# Patient Record
Sex: Male | Born: 1935 | Race: Black or African American | Hispanic: No | Marital: Married | Smoking: Former smoker
Health system: Southern US, Community
[De-identification: ages and names within clinical notes are randomized; demographics above are authoritative.]

## PROBLEM LIST (undated history)

## (undated) DIAGNOSIS — I10 Essential (primary) hypertension: Secondary | ICD-10-CM

## (undated) DIAGNOSIS — J189 Pneumonia, unspecified organism: Secondary | ICD-10-CM

## (undated) DIAGNOSIS — M1990 Unspecified osteoarthritis, unspecified site: Secondary | ICD-10-CM

## (undated) DIAGNOSIS — J449 Chronic obstructive pulmonary disease, unspecified: Secondary | ICD-10-CM

## (undated) DIAGNOSIS — R0600 Dyspnea, unspecified: Secondary | ICD-10-CM

## (undated) DIAGNOSIS — J9819 Other pulmonary collapse: Secondary | ICD-10-CM

## (undated) DIAGNOSIS — K219 Gastro-esophageal reflux disease without esophagitis: Secondary | ICD-10-CM

## (undated) DIAGNOSIS — M170 Bilateral primary osteoarthritis of knee: Secondary | ICD-10-CM

## (undated) HISTORY — DX: Essential (primary) hypertension: I10

## (undated) HISTORY — DX: Bilateral primary osteoarthritis of knee: M17.0

## (undated) HISTORY — PX: JOINT REPLACEMENT: SHX530

## (undated) HISTORY — PX: COLONOSCOPY: SHX174

## (undated) HISTORY — PX: DG  BONE DENSITY (ARMC HX): HXRAD1102

---

## 2015-11-30 DIAGNOSIS — I10 Essential (primary) hypertension: Secondary | ICD-10-CM | POA: Diagnosis not present

## 2015-11-30 DIAGNOSIS — N4289 Other specified disorders of prostate: Secondary | ICD-10-CM | POA: Diagnosis not present

## 2016-02-28 DIAGNOSIS — I10 Essential (primary) hypertension: Secondary | ICD-10-CM | POA: Diagnosis not present

## 2016-02-28 DIAGNOSIS — N4289 Other specified disorders of prostate: Secondary | ICD-10-CM | POA: Diagnosis not present

## 2016-02-28 DIAGNOSIS — Z6823 Body mass index (BMI) 23.0-23.9, adult: Secondary | ICD-10-CM | POA: Diagnosis not present

## 2016-07-08 DIAGNOSIS — Z6823 Body mass index (BMI) 23.0-23.9, adult: Secondary | ICD-10-CM | POA: Diagnosis not present

## 2016-07-08 DIAGNOSIS — Z0000 Encounter for general adult medical examination without abnormal findings: Secondary | ICD-10-CM | POA: Diagnosis not present

## 2016-07-08 DIAGNOSIS — N400 Enlarged prostate without lower urinary tract symptoms: Secondary | ICD-10-CM | POA: Diagnosis not present

## 2016-07-08 DIAGNOSIS — R002 Palpitations: Secondary | ICD-10-CM | POA: Diagnosis not present

## 2016-07-08 DIAGNOSIS — I10 Essential (primary) hypertension: Secondary | ICD-10-CM | POA: Diagnosis not present

## 2016-07-08 DIAGNOSIS — Z1389 Encounter for screening for other disorder: Secondary | ICD-10-CM | POA: Diagnosis not present

## 2016-07-08 DIAGNOSIS — Z131 Encounter for screening for diabetes mellitus: Secondary | ICD-10-CM | POA: Diagnosis not present

## 2016-08-25 DIAGNOSIS — Z87891 Personal history of nicotine dependence: Secondary | ICD-10-CM | POA: Diagnosis not present

## 2016-08-25 DIAGNOSIS — M810 Age-related osteoporosis without current pathological fracture: Secondary | ICD-10-CM | POA: Diagnosis not present

## 2016-08-25 DIAGNOSIS — M1990 Unspecified osteoarthritis, unspecified site: Secondary | ICD-10-CM | POA: Diagnosis not present

## 2016-08-25 DIAGNOSIS — I10 Essential (primary) hypertension: Secondary | ICD-10-CM | POA: Diagnosis not present

## 2016-08-25 DIAGNOSIS — Z13820 Encounter for screening for osteoporosis: Secondary | ICD-10-CM | POA: Diagnosis not present

## 2016-08-25 DIAGNOSIS — Z79899 Other long term (current) drug therapy: Secondary | ICD-10-CM | POA: Diagnosis not present

## 2016-10-02 DIAGNOSIS — Z125 Encounter for screening for malignant neoplasm of prostate: Secondary | ICD-10-CM | POA: Diagnosis not present

## 2016-10-02 DIAGNOSIS — M14862 Arthropathies in other specified diseases classified elsewhere, left knee: Secondary | ICD-10-CM | POA: Diagnosis not present

## 2016-10-02 DIAGNOSIS — Z6824 Body mass index (BMI) 24.0-24.9, adult: Secondary | ICD-10-CM | POA: Diagnosis not present

## 2016-10-02 DIAGNOSIS — N400 Enlarged prostate without lower urinary tract symptoms: Secondary | ICD-10-CM | POA: Diagnosis not present

## 2016-10-02 DIAGNOSIS — I10 Essential (primary) hypertension: Secondary | ICD-10-CM | POA: Diagnosis not present

## 2016-11-16 DIAGNOSIS — J069 Acute upper respiratory infection, unspecified: Secondary | ICD-10-CM | POA: Diagnosis not present

## 2016-11-16 DIAGNOSIS — J019 Acute sinusitis, unspecified: Secondary | ICD-10-CM | POA: Diagnosis not present

## 2016-11-16 DIAGNOSIS — J209 Acute bronchitis, unspecified: Secondary | ICD-10-CM | POA: Diagnosis not present

## 2016-12-31 DIAGNOSIS — M21611 Bunion of right foot: Secondary | ICD-10-CM | POA: Diagnosis not present

## 2016-12-31 DIAGNOSIS — Z0000 Encounter for general adult medical examination without abnormal findings: Secondary | ICD-10-CM | POA: Diagnosis not present

## 2016-12-31 DIAGNOSIS — M21612 Bunion of left foot: Secondary | ICD-10-CM | POA: Diagnosis not present

## 2016-12-31 DIAGNOSIS — Z79899 Other long term (current) drug therapy: Secondary | ICD-10-CM | POA: Diagnosis not present

## 2016-12-31 DIAGNOSIS — M2041 Other hammer toe(s) (acquired), right foot: Secondary | ICD-10-CM | POA: Diagnosis not present

## 2016-12-31 DIAGNOSIS — M130 Polyarthritis, unspecified: Secondary | ICD-10-CM | POA: Diagnosis not present

## 2016-12-31 DIAGNOSIS — Z791 Long term (current) use of non-steroidal anti-inflammatories (NSAID): Secondary | ICD-10-CM | POA: Diagnosis not present

## 2016-12-31 DIAGNOSIS — I10 Essential (primary) hypertension: Secondary | ICD-10-CM | POA: Diagnosis not present

## 2016-12-31 DIAGNOSIS — M2042 Other hammer toe(s) (acquired), left foot: Secondary | ICD-10-CM | POA: Diagnosis not present

## 2016-12-31 DIAGNOSIS — Z6824 Body mass index (BMI) 24.0-24.9, adult: Secondary | ICD-10-CM | POA: Diagnosis not present

## 2017-01-07 DIAGNOSIS — M14862 Arthropathies in other specified diseases classified elsewhere, left knee: Secondary | ICD-10-CM | POA: Diagnosis not present

## 2017-01-07 DIAGNOSIS — Z6823 Body mass index (BMI) 23.0-23.9, adult: Secondary | ICD-10-CM | POA: Diagnosis not present

## 2017-01-07 DIAGNOSIS — I10 Essential (primary) hypertension: Secondary | ICD-10-CM | POA: Diagnosis not present

## 2017-01-07 DIAGNOSIS — N400 Enlarged prostate without lower urinary tract symptoms: Secondary | ICD-10-CM | POA: Diagnosis not present

## 2017-04-09 DIAGNOSIS — Z6823 Body mass index (BMI) 23.0-23.9, adult: Secondary | ICD-10-CM | POA: Diagnosis not present

## 2017-04-09 DIAGNOSIS — N400 Enlarged prostate without lower urinary tract symptoms: Secondary | ICD-10-CM | POA: Diagnosis not present

## 2017-04-09 DIAGNOSIS — M14862 Arthropathies in other specified diseases classified elsewhere, left knee: Secondary | ICD-10-CM | POA: Diagnosis not present

## 2017-04-09 DIAGNOSIS — I10 Essential (primary) hypertension: Secondary | ICD-10-CM | POA: Diagnosis not present

## 2017-04-10 DIAGNOSIS — I10 Essential (primary) hypertension: Secondary | ICD-10-CM | POA: Diagnosis not present

## 2017-05-05 DIAGNOSIS — J209 Acute bronchitis, unspecified: Secondary | ICD-10-CM | POA: Diagnosis not present

## 2017-05-08 DIAGNOSIS — M25551 Pain in right hip: Secondary | ICD-10-CM | POA: Diagnosis not present

## 2017-05-08 DIAGNOSIS — Z79899 Other long term (current) drug therapy: Secondary | ICD-10-CM | POA: Diagnosis not present

## 2017-05-08 DIAGNOSIS — M85861 Other specified disorders of bone density and structure, right lower leg: Secondary | ICD-10-CM | POA: Diagnosis not present

## 2017-05-08 DIAGNOSIS — M25461 Effusion, right knee: Secondary | ICD-10-CM | POA: Diagnosis not present

## 2017-05-08 DIAGNOSIS — M479 Spondylosis, unspecified: Secondary | ICD-10-CM | POA: Diagnosis not present

## 2017-05-08 DIAGNOSIS — M5186 Other intervertebral disc disorders, lumbar region: Secondary | ICD-10-CM | POA: Diagnosis not present

## 2017-05-08 DIAGNOSIS — M545 Low back pain: Secondary | ICD-10-CM | POA: Diagnosis not present

## 2017-05-08 DIAGNOSIS — I10 Essential (primary) hypertension: Secondary | ICD-10-CM | POA: Diagnosis not present

## 2017-05-08 DIAGNOSIS — M5136 Other intervertebral disc degeneration, lumbar region: Secondary | ICD-10-CM | POA: Diagnosis not present

## 2017-05-08 DIAGNOSIS — R262 Difficulty in walking, not elsewhere classified: Secondary | ICD-10-CM | POA: Diagnosis not present

## 2017-05-08 DIAGNOSIS — M47816 Spondylosis without myelopathy or radiculopathy, lumbar region: Secondary | ICD-10-CM | POA: Diagnosis not present

## 2017-05-08 DIAGNOSIS — M5126 Other intervertebral disc displacement, lumbar region: Secondary | ICD-10-CM | POA: Diagnosis not present

## 2017-05-08 DIAGNOSIS — M160 Bilateral primary osteoarthritis of hip: Secondary | ICD-10-CM | POA: Diagnosis not present

## 2017-05-11 DIAGNOSIS — Z791 Long term (current) use of non-steroidal anti-inflammatories (NSAID): Secondary | ICD-10-CM | POA: Diagnosis not present

## 2017-05-11 DIAGNOSIS — J019 Acute sinusitis, unspecified: Secondary | ICD-10-CM | POA: Diagnosis not present

## 2017-05-11 DIAGNOSIS — R0600 Dyspnea, unspecified: Secondary | ICD-10-CM | POA: Diagnosis not present

## 2017-05-11 DIAGNOSIS — Z79899 Other long term (current) drug therapy: Secondary | ICD-10-CM | POA: Diagnosis not present

## 2017-05-11 DIAGNOSIS — M1990 Unspecified osteoarthritis, unspecified site: Secondary | ICD-10-CM | POA: Diagnosis not present

## 2017-05-11 DIAGNOSIS — N19 Unspecified kidney failure: Secondary | ICD-10-CM | POA: Diagnosis not present

## 2017-05-11 DIAGNOSIS — R0602 Shortness of breath: Secondary | ICD-10-CM | POA: Diagnosis not present

## 2017-05-11 DIAGNOSIS — R1013 Epigastric pain: Secondary | ICD-10-CM | POA: Diagnosis not present

## 2017-05-11 DIAGNOSIS — J209 Acute bronchitis, unspecified: Secondary | ICD-10-CM | POA: Diagnosis not present

## 2017-05-11 DIAGNOSIS — R5383 Other fatigue: Secondary | ICD-10-CM | POA: Diagnosis not present

## 2017-05-11 DIAGNOSIS — Z0131 Encounter for examination of blood pressure with abnormal findings: Secondary | ICD-10-CM | POA: Diagnosis not present

## 2017-05-11 DIAGNOSIS — Z87891 Personal history of nicotine dependence: Secondary | ICD-10-CM | POA: Diagnosis not present

## 2017-05-11 DIAGNOSIS — I10 Essential (primary) hypertension: Secondary | ICD-10-CM | POA: Diagnosis not present

## 2017-05-11 DIAGNOSIS — R079 Chest pain, unspecified: Secondary | ICD-10-CM | POA: Diagnosis not present

## 2017-05-11 DIAGNOSIS — D473 Essential (hemorrhagic) thrombocythemia: Secondary | ICD-10-CM | POA: Diagnosis not present

## 2017-05-26 ENCOUNTER — Encounter: Payer: Self-pay | Admitting: *Deleted

## 2017-05-28 ENCOUNTER — Encounter: Payer: Self-pay | Admitting: Cardiology

## 2017-05-28 NOTE — Progress Notes (Signed)
Cardiology Office Note  Date: 05/29/2017   ID: Ananias Kolander, DOB 1931-12-02, MRN [id number]  PCP: Neale Burly, MD  Consulting Cardiologist: Rozann Lesches, MD   Chief Complaint  Patient presents with  . Bradycardia    History of Present Illness: Noah Ray is an 83 y.o. male referred for cardiology consultation by Dr. Sherrie Sport the evaluation of bradycardia. Records indicate that he was seen in the ER at [facility] in late July complaining of weakness and shortness of breath. He had been seen at [facility] just prior to that with ECG reportedly showing sinus bradycardia at 48. Lab work and ECG as well as chest x-ray from ER visit are outlined below.  He is here today with a family member. His heart rate is in the 60s today. He states that he was taken off of atenolol by Dr. Sherrie Sport a few months ago related to bradycardia. He has had no syncope.  He does describe dyspnea on exertion however that has been going on for the last several weeks. 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 No specific chest pain at that time, but he does say that he had an episode of chest pain in the past while he was walking across the parking lot to work. He has no known history of ischemic heart disease but this has not been objectively evaluated recently.  He is also limited by significant bilateral knee arthritic pain, has had some right hip and flank pain with ambulation as well. He is using a cane. He has pending orthopedic consultation later this month.  I reviewed his medications. Antihypertensives include Norvasc and Lotensin at this point, neither of which should be significantly lowering his heart rate.  Past Medical History:  Diagnosis Date  . Essential hypertension   . Osteoarthritis of both knees     Past Surgical History:  Procedure Laterality Date  . DG  BONE DENSITY ([place] HX)      . REPAIR IMPERFORATE ANUS / ANORECTOPLASTY      Current Outpatient Prescriptions  Medication Sig Dispense Refill  . amLODipine (NORVASC) 5 MG tablet Take 5 mg by mouth daily.    . benazepril (LOTENSIN) 10 MG tablet Take 10 mg by mouth daily.    . Cholecalciferol (VITAMIN D3) 1000 units CAPS Take by mouth.    . meloxicam (MOBIC) 15 MG tablet Take 15 mg by mouth daily.    . Omega-3 Fatty Acids (FISH OIL) 1000 MG CAPS Take by mouth 3 (three) times daily.      No current facility-administered medications for this visit.    Allergies:  Patient has no known allergies.   Social History: The patient  reports that he has quit smoking. His smoking use included Cigarettes. He has never used smokeless tobacco. He reports that he does not drink alcohol or use drugs.   Family History: The patient's family history includes Prostate cancer in his father; Stomach cancer in his mother.   ROS:  Please see the history of present illness. Otherwise, complete review of systems is positive for bilateral knee pain, occasional hoarseness.  All other systems are reviewed and negative.   Physical Exam: VS:  BP 130/68   Pulse 65   Ht 6\' 5"  (1.956 m)   Wt 186 lb (84.4 kg)   SpO2 98%   BMI 22.06 kg/m , BMI Body mass index is 22.06  kg/m.  Wt Readings from Last 3 Encounters:  05/29/17 186 lb (84.4 kg)    General: Elderly male, appears comfortable at rest. HEENT: Conjunctiva and lids normal, oropharynx clear. Neck: Supple, no elevated JVP or carotid bruits, no thyromegaly. Lungs: Clear to auscultation, nonlabored breathing at rest. Cardiac: Regular rate and rhythm, no S3, soft systolic murmur, no pericardial rub. Abdomen: Soft, nontender, bowel sounds present, no guarding or rebound. Extremities: No pitting edema, distal pulses 2+. Skin: Warm and dry. Musculoskeletal: Mild kyphosis. Neuropsychiatric: Alert and oriented x3, affect grossly appropriate.  ECG: I personally reviewed the tracing from  05/11/2017 which showed sinus bradycardia at 51 bpm with decreased R wave progression and nonspecific T-wave changes.  Recent Labwork:  July 2018: Potassium 4.4, BUN 30, creatinine 1.4, AST 15, ALT 13, anti-proBNP 17.5, troponin T less than 0.01, TSH 1.22, hemoglobin 15.2, platelets 431  Other Studies Reviewed Today:  Chest x-ray 05/11/2017 ([facility]): No active cardiopulmonary disease.  Assessment and Plan:  1. Bradycardia. Agree with discontinuation of atenolol which was already done by Dr. Sherrie Sport. It sounds like however he has had documented bradycardia even since stopping the medication. No obvious syncope. ECG abnormal with decreased R wave progression and leftward axis, normal PR interval. We will obtain a 48-hour Holter monitor to better assess heart rate variability.  2. Dyspnea on exertion, worse in the last several weeks. Troponin T level was negative during ER evaluation in July. He has had chest pain in the past but not recently. Baseline ECG abnormal as discussed above. Cardiac risk factors include age and gender as well as hypertension and previous tobacco use. He has not undergone cardiac structural or ischemic evaluation. We will obtain an echocardiogram as well as a Lexiscan Myoview for more objective assessment.  3. Essential hypertension, blood pressure adequately controlled today on Norvasc and Lotensin.  4. Bilateral knee arthritis with pain on ambulation pending orthopedic consultation.  Current medicines were reviewed with the patient today.   Orders Placed This Encounter  Procedures  . NM Myocar Multi W/Spect W/Wall Motion / EF  . Cardiac event monitor  . ECHOCARDIOGRAM COMPLETE    Disposition: Call with test results.  Signed, Satira Sark, MD, [facility] 05/29/2017 9:28 AM    [facility] [place], [place], [place] [address] Phone: [phone]; Fax: [phone]

## 2017-05-29 ENCOUNTER — Encounter: Payer: Self-pay | Admitting: Cardiology

## 2017-05-29 ENCOUNTER — Ambulatory Visit (INDEPENDENT_AMBULATORY_CARE_PROVIDER_SITE_OTHER): Payer: Medicare HMO | Admitting: Cardiology

## 2017-05-29 ENCOUNTER — Telehealth: Payer: Self-pay | Admitting: Cardiology

## 2017-05-29 VITALS — BP 130/68 | HR 65 | Ht 77.0 in | Wt 186.0 lb

## 2017-05-29 DIAGNOSIS — R0609 Other forms of dyspnea: Secondary | ICD-10-CM | POA: Diagnosis not present

## 2017-05-29 DIAGNOSIS — R001 Bradycardia, unspecified: Secondary | ICD-10-CM

## 2017-05-29 DIAGNOSIS — I10 Essential (primary) hypertension: Secondary | ICD-10-CM

## 2017-05-29 DIAGNOSIS — M170 Bilateral primary osteoarthritis of knee: Secondary | ICD-10-CM

## 2017-05-29 DIAGNOSIS — R0600 Dyspnea, unspecified: Secondary | ICD-10-CM

## 2017-05-29 NOTE — Telephone Encounter (Signed)
Echo & Lexiscan for DOE/ abnormal ecg scheduled at [facility] Jun 11, 2017

## 2017-05-29 NOTE — Patient Instructions (Signed)
Medication Instructions:  Your physician recommends that you continue on your current medications as directed. Please refer to the Current Medication list given to you today.  Labwork: none  Testing/Procedures: Your physician has requested that you have an echocardiogram. Echocardiography is a painless test that uses sound waves to create images of your heart. It provides your doctor with information about the size and shape of your heart and how well your heart's chambers and valves are working. This procedure takes approximately one hour. There are no restrictions for this procedure.  Your physician has requested that you have a lexiscan myoview. For further information please visit [email or web address]. Please follow instruction sheet, as given.  Your physician has recommended that you wear a holter monitor FOR 48 HOURS. Holter monitors are medical devices that record the heart's electrical activity. Doctors most often use these monitors to diagnose arrhythmias. Arrhythmias are problems with the speed or rhythm of the heartbeat. The monitor is a small, portable device. You can wear one while you do your normal daily activities. This is usually used to diagnose what is causing palpitations/syncope (passing out).  Follow-Up: Your physician recommends that you schedule a follow-up appointment PENDING TEST RESULTS  Any Other Special Instructions Will Be Listed Below (If Applicable).  If you need a refill on your cardiac medications before your next appointment, please call your pharmacy.

## 2017-06-03 ENCOUNTER — Encounter (INDEPENDENT_AMBULATORY_CARE_PROVIDER_SITE_OTHER): Payer: Medicare HMO

## 2017-06-03 DIAGNOSIS — R0609 Other forms of dyspnea: Secondary | ICD-10-CM

## 2017-06-03 DIAGNOSIS — R001 Bradycardia, unspecified: Secondary | ICD-10-CM

## 2017-06-03 DIAGNOSIS — R0600 Dyspnea, unspecified: Secondary | ICD-10-CM

## 2017-06-03 NOTE — Addendum Note (Signed)
Addended by: Acquanetta Chain on: 06/03/2017 09:34 AM   Modules accepted: Orders

## 2017-06-08 ENCOUNTER — Encounter (HOSPITAL_COMMUNITY): Payer: Medicare HMO

## 2017-06-08 DIAGNOSIS — M160 Bilateral primary osteoarthritis of hip: Secondary | ICD-10-CM | POA: Diagnosis not present

## 2017-06-11 ENCOUNTER — Encounter (HOSPITAL_COMMUNITY)
Admission: RE | Admit: 2017-06-11 | Discharge: 2017-06-11 | Disposition: A | Discharge status: Home / Self Care | Payer: Medicare HMO | Source: Ambulatory Visit | Attending: Cardiology | Admitting: Cardiology

## 2017-06-11 ENCOUNTER — Ambulatory Visit (HOSPITAL_BASED_OUTPATIENT_CLINIC_OR_DEPARTMENT_OTHER)
Admission: RE | Admit: 2017-06-11 | Discharge: 2017-06-11 | Disposition: A | Discharge status: Home / Self Care | Payer: Medicare HMO | Source: Ambulatory Visit | Attending: Cardiology | Admitting: Cardiology

## 2017-06-11 ENCOUNTER — Encounter (HOSPITAL_COMMUNITY): Payer: Self-pay

## 2017-06-11 ENCOUNTER — Encounter (HOSPITAL_BASED_OUTPATIENT_CLINIC_OR_DEPARTMENT_OTHER)
Admission: RE | Admit: 2017-06-11 | Discharge: 2017-06-11 | Disposition: A | Discharge status: Home / Self Care | Payer: Medicare HMO | Source: Ambulatory Visit | Attending: Cardiology | Admitting: Cardiology

## 2017-06-11 DIAGNOSIS — R0600 Dyspnea, unspecified: Secondary | ICD-10-CM

## 2017-06-11 DIAGNOSIS — I081 Rheumatic disorders of both mitral and tricuspid valves: Secondary | ICD-10-CM | POA: Insufficient documentation

## 2017-06-11 DIAGNOSIS — R0609 Other forms of dyspnea: Secondary | ICD-10-CM | POA: Diagnosis not present

## 2017-06-11 LAB — ECHOCARDIOGRAM COMPLETE
AOVTI: 33.8 cm
AV Area mean vel: 3.34 cm2
AV VEL mean LVOT/AV: 0.88
AV peak Index: 1.68
AV vel: 3.41
AVA: 3.41 cm2
AVAREAMEANVIN: 1.57 cm2/m2
AVAREAVTI: 3.58 cm2
AVAREAVTIIND: 1.6 cm2/m2
AVG: 4 mmHg
AVPG: 10 mmHg
AVPKVEL: 156 cm/s
Ao pk vel: 0.94 m/s
CHL CUP AV VALUE AREA INDEX: 1.6
CHL CUP DOP CALC LVOT VTI: 30.3 cm
CHL CUP STROKE VOLUME: 53 mL
DOP CAL AO MEAN VELOCITY: 91.7 cm/s
EERAT: 5.9
EWDT: 324 ms
FS: 38 % (ref 28–44)
IVS/LV PW RATIO, ED: 1.34
LA diam end sys: 34 mm
LA diam index: 1.59 cm/m2
LA vol index: 22.3 mL/m2
LASIZE: 34 mm
LAVOL: 47.6 mL
LAVOLA4C: 46.1 mL
LV PW d: 8.67 mm — AB (ref 0.6–1.1)
LV SIMPSON'S DISK: 66
LV TDI E'LATERAL: 14.3
LV sys vol: 28 mL (ref 21–61)
LVDIAVOL: 80 mL (ref 62–150)
LVDIAVOLIN: 38 mL/m2
LVEEAVG: 5.9
LVEEMED: 5.9
LVELAT: 14.3 cm/s
LVOT area: 3.8 cm2
LVOT peak VTI: 0.9 cm
LVOT peak grad rest: 9 mmHg
LVOT peak vel: 147 cm/s
LVOTD: 22 mm
LVOTSV: 115 mL
LVSYSVOLIN: 13 mL/m2
MV Dec: 324
MV Peak grad: 3 mmHg
MV pk E vel: 84.4 m/s
MVPKAVEL: 115 m/s
RV LATERAL S' VELOCITY: 13.6 cm/s
RV TAPSE: 25.2 mm
RV sys press: 34 mmHg
Reg peak vel: 277 cm/s
TDI e' medial: 8.27
TR max vel: 277 cm/s

## 2017-06-11 LAB — NM MYOCAR MULTI W/SPECT W/WALL MOTION / EF
CHL CUP NUCLEAR SDS: 1
LV dias vol: 95 mL (ref 62–150)
LVSYSVOL: 27 mL
Peak HR: 89 {beats}/min
RATE: 0.31
Rest HR: 55 {beats}/min
SRS: 6
SSS: 7
TID: 1.23

## 2017-06-11 MED ORDER — SODIUM CHLORIDE 0.9% FLUSH
INTRAVENOUS | Status: AC
  Administered 2017-06-11: 10 mL via INTRAVENOUS
  Filled 2017-06-11: qty 10

## 2017-06-11 MED ORDER — TECHNETIUM TC 99M TETROFOSMIN IV KIT
10.0000 | PACK | Freq: Once | INTRAVENOUS | Status: AC | PRN
  Administered 2017-06-11: 11 via INTRAVENOUS

## 2017-06-11 MED ORDER — TECHNETIUM TC 99M TETROFOSMIN IV KIT
30.0000 | PACK | Freq: Once | INTRAVENOUS | Status: AC | PRN
  Administered 2017-06-11: 30 via INTRAVENOUS

## 2017-06-11 MED ORDER — REGADENOSON 0.4 MG/5ML IV SOLN
INTRAVENOUS | Status: AC
  Administered 2017-06-11: 0.4 mg via INTRAVENOUS
  Filled 2017-06-11: qty 5

## 2017-06-11 NOTE — Progress Notes (Signed)
*  PRELIMINARY RESULTS* Echocardiogram 2D Echocardiogram has been performed.  Noah Ray 06/11/2017, 10:07 AM

## 2017-06-12 ENCOUNTER — Telehealth: Payer: Self-pay

## 2017-06-12 NOTE — Telephone Encounter (Signed)
Patient notified. Routed to PCP 

## 2017-06-12 NOTE — Telephone Encounter (Signed)
-----   Message from Merlene Laughter, LPN sent at 7/00/1749 12:16 PM EDT -----   ----- Message ----- From: Satira Sark, MD Sent: 06/12/2017   9:57 AM To: Merlene Laughter, LPN  Results reviewed. Monitoring was overall reassuring, no evidence of heart block or pauses to suggest further evaluation of bradycardia at this time. In light of his recent echocardiogram and Myoview results, no further cardiac testing is planned. He can keep follow-up with Dr. Sherrie Sport for now. A copy of this test should be forwarded to Neale Burly, MD.

## 2017-06-12 NOTE — Telephone Encounter (Signed)
-----   Message from Merlene Laughter, LPN sent at 0/81/3887  7:37 AM EDT -----   ----- Message ----- From: Satira Sark, MD Sent: 06/11/2017   3:46 PM To: Merlene Laughter, LPN  Results reviewed. Overall reassuring, low risk stress test without significant ischemic burden and normal LVEF. Await cardiac monitor results. A copy of this test should be forwarded to Neale Burly, MD.

## 2017-06-12 NOTE — Telephone Encounter (Signed)
-----   Message from Merlene Laughter, LPN sent at 7/85/8850  7:38 AM EDT -----   ----- Message ----- From: Satira Sark, MD Sent: 06/11/2017  10:52 AM To: Merlene Laughter, LPN  Results reviewed. LVEF is normal at 60-65% with only mild diastolic dysfunction. No major valvular abnormalities to explain shortness of breath. A copy of this test should be forwarded to Neale Burly, MD.

## 2017-07-10 DIAGNOSIS — I10 Essential (primary) hypertension: Secondary | ICD-10-CM | POA: Diagnosis not present

## 2017-07-10 DIAGNOSIS — Z0000 Encounter for general adult medical examination without abnormal findings: Secondary | ICD-10-CM | POA: Diagnosis not present

## 2017-07-10 DIAGNOSIS — Z1389 Encounter for screening for other disorder: Secondary | ICD-10-CM | POA: Diagnosis not present

## 2017-07-10 DIAGNOSIS — Z6822 Body mass index (BMI) 22.0-22.9, adult: Secondary | ICD-10-CM | POA: Diagnosis not present

## 2017-07-10 DIAGNOSIS — M14862 Arthropathies in other specified diseases classified elsewhere, left knee: Secondary | ICD-10-CM | POA: Diagnosis not present

## 2017-07-10 DIAGNOSIS — N400 Enlarged prostate without lower urinary tract symptoms: Secondary | ICD-10-CM | POA: Diagnosis not present

## 2017-07-10 DIAGNOSIS — J441 Chronic obstructive pulmonary disease with (acute) exacerbation: Secondary | ICD-10-CM | POA: Diagnosis not present

## 2017-08-17 DIAGNOSIS — J441 Chronic obstructive pulmonary disease with (acute) exacerbation: Secondary | ICD-10-CM | POA: Diagnosis not present

## 2017-08-17 DIAGNOSIS — M14862 Arthropathies in other specified diseases classified elsewhere, left knee: Secondary | ICD-10-CM | POA: Diagnosis not present

## 2017-08-17 DIAGNOSIS — I10 Essential (primary) hypertension: Secondary | ICD-10-CM | POA: Diagnosis not present

## 2017-08-17 DIAGNOSIS — Z6823 Body mass index (BMI) 23.0-23.9, adult: Secondary | ICD-10-CM | POA: Diagnosis not present

## 2017-08-17 DIAGNOSIS — N400 Enlarged prostate without lower urinary tract symptoms: Secondary | ICD-10-CM | POA: Diagnosis not present

## 2017-09-09 DIAGNOSIS — M170 Bilateral primary osteoarthritis of knee: Secondary | ICD-10-CM | POA: Diagnosis not present

## 2017-09-09 DIAGNOSIS — M160 Bilateral primary osteoarthritis of hip: Secondary | ICD-10-CM | POA: Diagnosis not present

## 2017-09-09 DIAGNOSIS — M25551 Pain in right hip: Secondary | ICD-10-CM | POA: Diagnosis not present

## 2017-10-27 DIAGNOSIS — M1611 Unilateral primary osteoarthritis, right hip: Secondary | ICD-10-CM | POA: Diagnosis not present

## 2017-10-27 DIAGNOSIS — M25551 Pain in right hip: Secondary | ICD-10-CM | POA: Diagnosis not present

## 2017-10-27 DIAGNOSIS — M1612 Unilateral primary osteoarthritis, left hip: Secondary | ICD-10-CM | POA: Diagnosis not present

## 2017-10-28 DIAGNOSIS — M25552 Pain in left hip: Secondary | ICD-10-CM | POA: Diagnosis not present

## 2017-10-28 DIAGNOSIS — M25551 Pain in right hip: Secondary | ICD-10-CM | POA: Diagnosis not present

## 2017-10-28 DIAGNOSIS — Z6824 Body mass index (BMI) 24.0-24.9, adult: Secondary | ICD-10-CM | POA: Diagnosis not present

## 2017-11-04 ENCOUNTER — Ambulatory Visit: Payer: Self-pay | Admitting: Orthopedic Surgery

## 2017-11-10 NOTE — Pre-Procedure Instructions (Signed)
Noah Ray  11/10/2017      [facility] [address] [place] [address] Phone: [phone] Fax: [phone]    Your procedure is scheduled on 11/16/2017.  Report to [facility] Admitting at [place].M.  Call this number if you have problems the morning of surgery:  [phone]   Remember:  Do not eat food or drink liquids after midnight.   Continue all medications as directed by your physician except follow these medication instructions before surgery below   Take these medicines the morning of surgery with A SIP OF WATER: Amlodipine (Norvasc)  7 days prior to surgery STOP taking any Meloxicam (Mobic), Aspirin (unless otherwise instructed by your surgeon), Aleve, Naproxen, Ibuprofen, Motrin, Advil, Goody's, BC's, all herbal medications, fish oil, and all vitamins    Do not wear jewelry  Do not wear lotions, powders, or colognes, or deodorant.  Men may shave face and neck.  Do not bring valuables to the hospital.  [facility] is not responsible for any belongings or valuables.  Hearing aids, eyeglasses, contacts, dentures or bridgework may not be worn into surgery.  Leave your suitcase in the car.  After surgery it may be brought to your room.  For patients admitted to the hospital, discharge time will be determined by your treatment team.  Patients discharged the day of surgery will not be allowed to drive home.   Name and phone number of your driver:    Special instructions:   [facility]- Preparing For Surgery  Before surgery, you can play an important role. Because skin is not sterile, your skin needs to be as free of germs as possible. You can reduce the number of germs on your skin by washing with CHG (chlorahexidine gluconate) Soap before surgery.  CHG is an antiseptic cleaner which kills germs and bonds with the skin to continue killing germs even after washing.  Please do not use if  you have an allergy to CHG or antibacterial soaps. If your skin becomes reddened/irritated stop using the CHG.  Do not shave (including legs and underarms) for at least 48 hours prior to first CHG shower. It is OK to shave your face.  Please follow these instructions carefully.   1. Shower the NIGHT BEFORE SURGERY and the MORNING OF SURGERY with CHG.   2. If you chose to wash your hair, wash your hair first as usual with your normal shampoo.  3. After you shampoo, rinse your hair and body thoroughly to remove the shampoo.  4. Use CHG as you would any other liquid soap. You can apply CHG directly to the skin and wash gently with a scrungie or a clean washcloth.   5. Apply the CHG Soap to your body ONLY FROM THE NECK DOWN.  Do not use on open wounds or open sores. Avoid contact with your eyes, ears, mouth and genitals (private parts). Wash Face and genitals (private parts)  with your normal soap.  6. Wash thoroughly, paying special attention to the area where your surgery will be performed.  7. Thoroughly rinse your body with warm water from the neck down.  8. DO NOT shower/wash with your normal soap after using and rinsing off the CHG Soap.  9. Pat yourself dry with a CLEAN TOWEL.  10. Wear CLEAN PAJAMAS to bed the night before surgery, wear comfortable clothes the morning of surgery  11. Place CLEAN SHEETS on your bed the night  of your first shower and DO NOT SLEEP WITH PETS.    Day of Surgery: Shower as stated above. Do not apply any deodorants/lotions.  Please wear clean clothes to the hospital/surgery center.      Please read over the following fact sheets that you were given.

## 2017-11-11 ENCOUNTER — Other Ambulatory Visit: Payer: Self-pay

## 2017-11-11 ENCOUNTER — Encounter (HOSPITAL_COMMUNITY)
Admission: RE | Admit: 2017-11-11 | Discharge: 2017-11-11 | Disposition: A | Discharge status: Home / Self Care | Payer: Medicare HMO | Source: Ambulatory Visit | Attending: Orthopedic Surgery | Admitting: Orthopedic Surgery

## 2017-11-11 ENCOUNTER — Encounter (HOSPITAL_COMMUNITY): Payer: Self-pay

## 2017-11-11 DIAGNOSIS — M13851 Other specified arthritis, right hip: Secondary | ICD-10-CM | POA: Diagnosis not present

## 2017-11-11 DIAGNOSIS — Z01812 Encounter for preprocedural laboratory examination: Secondary | ICD-10-CM | POA: Diagnosis present

## 2017-11-11 HISTORY — DX: Other pulmonary collapse: J98.19

## 2017-11-11 HISTORY — DX: Chronic obstructive pulmonary disease, unspecified: J44.9

## 2017-11-11 HISTORY — DX: Dyspnea, unspecified: R06.00

## 2017-11-11 LAB — BASIC METABOLIC PANEL
Anion gap: 12 (ref 5–15)
BUN: 24 mg/dL — ABNORMAL HIGH (ref 6–20)
CHLORIDE: 106 mmol/L (ref 101–111)
CO2: 20 mmol/L — ABNORMAL LOW (ref 22–32)
Calcium: 9.9 mg/dL (ref 8.9–10.3)
Creatinine, Ser: 1.5 mg/dL — ABNORMAL HIGH (ref 0.61–1.24)
GFR calc non Af Amer: 41 mL/min — ABNORMAL LOW (ref >60)
GFR, EST AFRICAN AMERICAN: 48 mL/min — AB (ref >60)
Glucose, Bld: 138 mg/dL — ABNORMAL HIGH (ref 65–99)
Potassium: 4.2 mmol/L (ref 3.5–5.1)
SODIUM: 138 mmol/L (ref 135–145)

## 2017-11-11 LAB — CBC
HCT: 44.1 % (ref 39.0–52.0)
HEMOGLOBIN: 15.4 g/dL (ref 13.0–17.0)
MCH: 31 pg (ref 26.0–34.0)
MCHC: 34.9 g/dL (ref 30.0–36.0)
MCV: 88.9 fL (ref 78.0–100.0)
Platelets: 293 10*3/uL (ref 150–400)
RBC: 4.96 MIL/uL (ref 4.22–5.81)
RDW: 13.6 % (ref 11.5–15.5)
WBC: 5.9 10*3/uL (ref 4.0–10.5)

## 2017-11-11 LAB — TYPE AND SCREEN
ABO/RH(D): B POS
Antibody Screen: NEGATIVE

## 2017-11-11 LAB — SURGICAL PCR SCREEN
MRSA, PCR: NEGATIVE
Staphylococcus aureus: NEGATIVE

## 2017-11-11 LAB — ABO/RH: ABO/RH(D): B POS

## 2017-11-11 NOTE — Progress Notes (Signed)
PCP - Stoney Bang Cardiologist - patient denies  Chest x-ray - n/a EKG - 05/11/2017 Stress Test - 06/11/2017 ECHO - 06/11/2017 Cardiac Cath - patient denies  Sleep Study - patient denies   Anesthesia review: n/a  Patient denies shortness of breath, fever, cough and chest pain at PAT appointment   Patient verbalized understanding of instructions that were given to them at the PAT appointment. Patient was also instructed that they will need to review over the PAT instructions again at home before surgery.

## 2017-11-13 ENCOUNTER — Ambulatory Visit: Payer: Self-pay | Admitting: Orthopedic Surgery

## 2017-11-13 MED ORDER — ACETAMINOPHEN 10 MG/ML IV SOLN
1000.0000 mg | INTRAVENOUS | Status: AC
  Administered 2017-11-16: 1000 mg via INTRAVENOUS
  Filled 2017-11-13: qty 100

## 2017-11-13 MED ORDER — SODIUM CHLORIDE 0.9 % IV SOLN
1000.0000 mg | INTRAVENOUS | Status: AC
  Administered 2017-11-16: 1000 mg via INTRAVENOUS
  Filled 2017-11-13: qty 1100

## 2017-11-13 MED ORDER — CEFAZOLIN SODIUM-DEXTROSE 2-4 GM/100ML-% IV SOLN
2.0000 g | INTRAVENOUS | Status: AC
  Administered 2017-11-16: 2 g via INTRAVENOUS
  Filled 2017-11-13: qty 100

## 2017-11-13 MED ORDER — SODIUM CHLORIDE 0.9 % IV SOLN: INTRAVENOUS | Status: DC

## 2017-11-13 NOTE — H&P (View-Only) (Signed)
TOTAL HIP ADMISSION H&P  Patient is admitted for right total hip arthroplasty.  Subjective:  Chief Complaint: right hip pain  HPI: Noah Ray, 84 y.o. male, has a history of pain and functional disability in the right hip(s) due to arthritis and patient has failed non-surgical conservative treatments for greater than 12 weeks to include NSAID's and/or analgesics, flexibility and strengthening excercises, use of assistive devices, weight reduction as appropriate and activity modification.  Onset of symptoms was gradual starting 5 years ago with rapidlly worsening course since that time.The patient noted no past surgery on the right hip(s).  Patient currently rates pain in the right hip at 10 out of 10 with activity. Patient has night pain, worsening of pain with activity and weight bearing, trendelenberg gait, pain that interfers with activities of daily living, pain with passive range of motion and crepitus. Patient has evidence of subchondral cysts, subchondral sclerosis, periarticular osteophytes and joint space narrowing by imaging studies. This condition presents safety issues increasing the risk of falls. There is no current active infection.  There are no active problems to display for this patient.  Past Medical History:  Diagnosis Date  . Collapsed lung    history of; 35+ years ago  . COPD (chronic obstructive pulmonary disease) ([place])   . Dyspnea    occasional with exertion  . Essential hypertension   . Osteoarthritis of both knees     Past Surgical History:  Procedure Laterality Date  . DG  BONE DENSITY ([place] HX)    . REPAIR IMPERFORATE ANUS / ANORECTOPLASTY      Current Outpatient Medications  Medication Sig Dispense Refill Last Dose  . acetaminophen (TYLENOL) 650 MG CR tablet Take 1,300 mg by mouth 2 (two) times daily as needed for pain.     Marland Kitchen albuterol (VENTOLIN HFA) 108 (90 Base) MCG/ACT inhaler Inhale 1 puff into the lungs every 6 (six) hours as needed for wheezing or  shortness of breath.     Marland Kitchen amLODipine (NORVASC) 5 MG tablet Take 5 mg by mouth daily.   Taking  . benazepril (LOTENSIN) 10 MG tablet Take 10 mg by mouth daily.   Taking  . Cholecalciferol (VITAMIN D3) 1000 units CAPS Take 1,000 Units by mouth daily.    Taking  . Glucosamine-Chondroitin (MOVE FREE PO) Take 1 tablet by mouth daily.     . meloxicam (MOBIC) 15 MG tablet Take 15 mg by mouth daily.   11/06/2017  . Omega-3 Fatty Acids (FISH OIL) 1000 MG CAPS Take 1,000 mg by mouth 2 (two) times daily.    11/06/2017   No current facility-administered medications for this visit.    Facility-Administered Medications Ordered in Other Visits  Medication Dose Route Frequency Provider Last Rate Last Dose  . [START ON 11/16/2017] 0.9 %  sodium chloride infusion   Intravenous Continuous Ellicia Alix, Aaron Edelman, MD      . Derrill Memo ON 11/16/2017] acetaminophen (OFIRMEV) IV 1,000 mg  1,000 mg Intravenous To OR Dennice Tindol, Aaron Edelman, MD      . Derrill Memo ON 11/16/2017] ceFAZolin (ANCEF) IVPB 2g/100 mL premix  2 g Intravenous To SS-Surg Evarose Altland, Aaron Edelman, MD      . Derrill Memo ON 11/16/2017] tranexamic acid (CYKLOKAPRON) 1,000 mg in sodium chloride 0.9 % 100 mL IVPB  1,000 mg Intravenous To OR Takia Runyon, Aaron Edelman, MD       No Known Allergies  Social History   Tobacco Use  . Smoking status: Former Smoker    Types: Cigarettes  . Smokeless tobacco: Never Used  Substance Use Topics  . Alcohol use: Yes    Comment: rarely    Family History  Problem Relation Age of Onset  . Stomach cancer Mother   . Prostate cancer Father      Review of Systems  Constitutional: Negative.   HENT: Negative.   Eyes: Negative.   Respiratory: Positive for shortness of breath.   Cardiovascular: Negative.   Gastrointestinal: Positive for constipation.  Genitourinary: Positive for frequency.  Musculoskeletal: Positive for joint pain.  Skin: Negative.   Neurological: Negative.   Endo/Heme/Allergies: Negative.   Psychiatric/Behavioral: Negative.      Objective:  Physical Exam  Vitals reviewed. Constitutional: He is oriented to person, place, and time. He appears well-developed and well-nourished.  HENT:  Head: Normocephalic and atraumatic.  Eyes: Conjunctivae and EOM are normal. Pupils are equal, round, and reactive to light.  Neck: Normal range of motion. No thyromegaly present.  Cardiovascular: Normal rate and intact distal pulses.  Respiratory: Effort normal. No respiratory distress.  GI: Soft. He exhibits no distension.  Genitourinary:  Genitourinary Comments: deferred  Musculoskeletal:       Right hip: He exhibits decreased range of motion, bony tenderness and crepitus.  Neurological: He is alert and oriented to person, place, and time. He has normal reflexes.  Skin: Skin is warm and dry.  Psychiatric: He has a normal mood and affect. His behavior is normal. Judgment and thought content normal.    Vital signs in last 24 hours: @VSRANGES @  Labs:   Estimated body mass index is 24.19 kg/m as calculated from the following:   Height as of 11/11/17: 6\' 5"  (1.956 m).   Weight as of 11/11/17: 92.5 kg (204 lb).   Imaging Review Plain radiographs demonstrate severe degenerative joint disease of the right hip(s). The bone quality appears to be adequate for age and reported activity level.  Assessment/Plan:  End stage arthritis, right hip(s)  The patient history, physical examination, clinical judgement of the provider and imaging studies are consistent with end stage degenerative joint disease of the right hip(s) and total hip arthroplasty is deemed medically necessary. The treatment options including medical management, injection therapy, arthroscopy and arthroplasty were discussed at length. The risks and benefits of total hip arthroplasty were presented and reviewed. The risks due to aseptic loosening, infection, stiffness, dislocation/subluxation,  thromboembolic complications and other imponderables were discussed.  The  patient acknowledged the explanation, agreed to proceed with the plan and consent was signed. Patient is being admitted for inpatient treatment for surgery, pain control, PT, OT, prophylactic antibiotics, VTE prophylaxis, progressive ambulation and ADL's and discharge planning.The patient is planning to be discharged home with HEP. Has DME already.

## 2017-11-13 NOTE — H&P (Signed)
TOTAL HIP ADMISSION H&P  Patient is admitted for right total hip arthroplasty.  Subjective:  Chief Complaint: right hip pain  HPI: Noah Ray, 84 y.o. male, has a history of pain and functional disability in the right hip(s) due to arthritis and patient has failed non-surgical conservative treatments for greater than 12 weeks to include NSAID's and/or analgesics, flexibility and strengthening excercises, use of assistive devices, weight reduction as appropriate and activity modification.  Onset of symptoms was gradual starting 5 years ago with rapidlly worsening course since that time.The patient noted no past surgery on the right hip(s).  Patient currently rates pain in the right hip at 10 out of 10 with activity. Patient has night pain, worsening of pain with activity and weight bearing, trendelenberg gait, pain that interfers with activities of daily living, pain with passive range of motion and crepitus. Patient has evidence of subchondral cysts, subchondral sclerosis, periarticular osteophytes and joint space narrowing by imaging studies. This condition presents safety issues increasing the risk of falls. There is no current active infection.  There are no active problems to display for this patient.  Past Medical History:  Diagnosis Date  . Collapsed lung    history of; 35+ years ago  . COPD (chronic obstructive pulmonary disease) ([place])   . Dyspnea    occasional with exertion  . Essential hypertension   . Osteoarthritis of both knees     Past Surgical History:  Procedure Laterality Date  . DG  BONE DENSITY ([place] HX)    . REPAIR IMPERFORATE ANUS / ANORECTOPLASTY      Current Outpatient Medications  Medication Sig Dispense Refill Last Dose  . acetaminophen (TYLENOL) 650 MG CR tablet Take 1,300 mg by mouth 2 (two) times daily as needed for pain.     Marland Kitchen albuterol (VENTOLIN HFA) 108 (90 Base) MCG/ACT inhaler Inhale 1 puff into the lungs every 6 (six) hours as needed for wheezing or  shortness of breath.     Marland Kitchen amLODipine (NORVASC) 5 MG tablet Take 5 mg by mouth daily.   Taking  . benazepril (LOTENSIN) 10 MG tablet Take 10 mg by mouth daily.   Taking  . Cholecalciferol (VITAMIN D3) 1000 units CAPS Take 1,000 Units by mouth daily.    Taking  . Glucosamine-Chondroitin (MOVE FREE PO) Take 1 tablet by mouth daily.     . meloxicam (MOBIC) 15 MG tablet Take 15 mg by mouth daily.   11/06/2017  . Omega-3 Fatty Acids (FISH OIL) 1000 MG CAPS Take 1,000 mg by mouth 2 (two) times daily.    11/06/2017   No current facility-administered medications for this visit.    Facility-Administered Medications Ordered in Other Visits  Medication Dose Route Frequency Provider Last Rate Last Dose  . [START ON 11/16/2017] 0.9 %  sodium chloride infusion   Intravenous Continuous Beni Turrell, Aaron Edelman, MD      . Derrill Memo ON 11/16/2017] acetaminophen (OFIRMEV) IV 1,000 mg  1,000 mg Intravenous To OR Sion Thane, Aaron Edelman, MD      . Derrill Memo ON 11/16/2017] ceFAZolin (ANCEF) IVPB 2g/100 mL premix  2 g Intravenous To SS-Surg Naleah Kofoed, Aaron Edelman, MD      . Derrill Memo ON 11/16/2017] tranexamic acid (CYKLOKAPRON) 1,000 mg in sodium chloride 0.9 % 100 mL IVPB  1,000 mg Intravenous To OR Dorina Ribaudo, Aaron Edelman, MD       No Known Allergies  Social History   Tobacco Use  . Smoking status: Former Smoker    Types: Cigarettes  . Smokeless tobacco: Never Used  Substance Use Topics  . Alcohol use: Yes    Comment: rarely    Family History  Problem Relation Age of Onset  . Stomach cancer Mother   . Prostate cancer Father      Review of Systems  Constitutional: Negative.   HENT: Negative.   Eyes: Negative.   Respiratory: Positive for shortness of breath.   Cardiovascular: Negative.   Gastrointestinal: Positive for constipation.  Genitourinary: Positive for frequency.  Musculoskeletal: Positive for joint pain.  Skin: Negative.   Neurological: Negative.   Endo/Heme/Allergies: Negative.   Psychiatric/Behavioral: Negative.      Objective:  Physical Exam  Vitals reviewed. Constitutional: He is oriented to person, place, and time. He appears well-developed and well-nourished.  HENT:  Head: Normocephalic and atraumatic.  Eyes: Conjunctivae and EOM are normal. Pupils are equal, round, and reactive to light.  Neck: Normal range of motion. No thyromegaly present.  Cardiovascular: Normal rate and intact distal pulses.  Respiratory: Effort normal. No respiratory distress.  GI: Soft. He exhibits no distension.  Genitourinary:  Genitourinary Comments: deferred  Musculoskeletal:       Right hip: He exhibits decreased range of motion, bony tenderness and crepitus.  Neurological: He is alert and oriented to person, place, and time. He has normal reflexes.  Skin: Skin is warm and dry.  Psychiatric: He has a normal mood and affect. His behavior is normal. Judgment and thought content normal.    Vital signs in last 24 hours: @VSRANGES @  Labs:   Estimated body mass index is 24.19 kg/m as calculated from the following:   Height as of 11/11/17: 6\' 5"  (1.956 m).   Weight as of 11/11/17: 92.5 kg (204 lb).   Imaging Review Plain radiographs demonstrate severe degenerative joint disease of the right hip(s). The bone quality appears to be adequate for age and reported activity level.  Assessment/Plan:  End stage arthritis, right hip(s)  The patient history, physical examination, clinical judgement of the provider and imaging studies are consistent with end stage degenerative joint disease of the right hip(s) and total hip arthroplasty is deemed medically necessary. The treatment options including medical management, injection therapy, arthroscopy and arthroplasty were discussed at length. The risks and benefits of total hip arthroplasty were presented and reviewed. The risks due to aseptic loosening, infection, stiffness, dislocation/subluxation,  thromboembolic complications and other imponderables were discussed.  The  patient acknowledged the explanation, agreed to proceed with the plan and consent was signed. Patient is being admitted for inpatient treatment for surgery, pain control, PT, OT, prophylactic antibiotics, VTE prophylaxis, progressive ambulation and ADL's and discharge planning.The patient is planning to be discharged home with HEP. Has DME already.

## 2017-11-16 ENCOUNTER — Inpatient Hospital Stay (HOSPITAL_COMMUNITY)
Admission: RE | Admit: 2017-11-16 | Discharge: 2017-11-17 | DRG: 470 | Disposition: A | Discharge status: Home / Self Care | Payer: Medicare HMO | Source: Ambulatory Visit | Attending: Orthopedic Surgery | Admitting: Orthopedic Surgery

## 2017-11-16 ENCOUNTER — Inpatient Hospital Stay (HOSPITAL_COMMUNITY): Payer: Medicare HMO

## 2017-11-16 ENCOUNTER — Inpatient Hospital Stay (HOSPITAL_COMMUNITY): Payer: Medicare HMO | Admitting: Certified Registered"

## 2017-11-16 ENCOUNTER — Encounter (HOSPITAL_COMMUNITY): Payer: Self-pay | Admitting: Urology

## 2017-11-16 ENCOUNTER — Encounter (HOSPITAL_COMMUNITY)
Admission: RE | Disposition: A | Discharge status: Home / Self Care | Payer: Self-pay | Source: Ambulatory Visit | Attending: Orthopedic Surgery

## 2017-11-16 DIAGNOSIS — I10 Essential (primary) hypertension: Secondary | ICD-10-CM | POA: Diagnosis not present

## 2017-11-16 DIAGNOSIS — Z791 Long term (current) use of non-steroidal anti-inflammatories (NSAID): Secondary | ICD-10-CM

## 2017-11-16 DIAGNOSIS — Z87891 Personal history of nicotine dependence: Secondary | ICD-10-CM

## 2017-11-16 DIAGNOSIS — J449 Chronic obstructive pulmonary disease, unspecified: Secondary | ICD-10-CM | POA: Diagnosis not present

## 2017-11-16 DIAGNOSIS — Z09 Encounter for follow-up examination after completed treatment for conditions other than malignant neoplasm: Secondary | ICD-10-CM

## 2017-11-16 DIAGNOSIS — K5900 Constipation, unspecified: Secondary | ICD-10-CM | POA: Diagnosis not present

## 2017-11-16 DIAGNOSIS — Z23 Encounter for immunization: Secondary | ICD-10-CM

## 2017-11-16 DIAGNOSIS — Z96641 Presence of right artificial hip joint: Secondary | ICD-10-CM | POA: Diagnosis not present

## 2017-11-16 DIAGNOSIS — Z419 Encounter for procedure for purposes other than remedying health state, unspecified: Secondary | ICD-10-CM

## 2017-11-16 DIAGNOSIS — M1611 Unilateral primary osteoarthritis, right hip: Secondary | ICD-10-CM | POA: Diagnosis not present

## 2017-11-16 DIAGNOSIS — Z471 Aftercare following joint replacement surgery: Secondary | ICD-10-CM | POA: Diagnosis not present

## 2017-11-16 DIAGNOSIS — M25551 Pain in right hip: Secondary | ICD-10-CM | POA: Diagnosis present

## 2017-11-16 HISTORY — PX: TOTAL HIP ARTHROPLASTY: SHX124

## 2017-11-16 HISTORY — DX: Unspecified osteoarthritis, unspecified site: M19.90

## 2017-11-16 HISTORY — DX: Pneumonia, unspecified organism: J18.9

## 2017-11-16 SURGERY — ARTHROPLASTY, HIP, TOTAL, ANTERIOR APPROACH
Anesthesia: Spinal | Laterality: Right

## 2017-11-16 MED ORDER — HYDROCODONE-ACETAMINOPHEN 5-325 MG PO TABS
2.0000 | ORAL_TABLET | ORAL | Status: DC | PRN
  Administered 2017-11-16: 2 via ORAL

## 2017-11-16 MED ORDER — METOCLOPRAMIDE HCL 5 MG PO TABS: 5.0000 mg | ORAL_TABLET | Freq: Three times a day (TID) | ORAL | Status: DC | PRN

## 2017-11-16 MED ORDER — KETOROLAC TROMETHAMINE 15 MG/ML IJ SOLN
7.5000 mg | Freq: Four times a day (QID) | INTRAMUSCULAR | Status: AC
  Administered 2017-11-16 – 2017-11-17 (×4): 7.5 mg via INTRAVENOUS
  Filled 2017-11-16 (×3): qty 1

## 2017-11-16 MED ORDER — SUGAMMADEX SODIUM 200 MG/2ML IV SOLN
INTRAVENOUS | Status: DC | PRN
  Administered 2017-11-16: 190 mg via INTRAVENOUS

## 2017-11-16 MED ORDER — POLYETHYLENE GLYCOL 3350 17 G PO PACK: 17.0000 g | PACK | Freq: Every day | ORAL | Status: DC | PRN

## 2017-11-16 MED ORDER — SUGAMMADEX SODIUM 200 MG/2ML IV SOLN
INTRAVENOUS | Status: AC
  Filled 2017-11-16: qty 2

## 2017-11-16 MED ORDER — KETOROLAC TROMETHAMINE 30 MG/ML IJ SOLN
INTRAMUSCULAR | Status: DC | PRN
  Administered 2017-11-16: 30 mg via INTRA_ARTICULAR

## 2017-11-16 MED ORDER — SODIUM CHLORIDE 0.9 % IV SOLN
INTRAVENOUS | Status: DC
  Administered 2017-11-16: 17:00:00 via INTRAVENOUS

## 2017-11-16 MED ORDER — OMEGA-3-ACID ETHYL ESTERS 1 G PO CAPS
1.0000 g | ORAL_CAPSULE | Freq: Two times a day (BID) | ORAL | Status: DC
  Administered 2017-11-16 – 2017-11-17 (×2): 1 g via ORAL
  Filled 2017-11-16 (×2): qty 1

## 2017-11-16 MED ORDER — POVIDONE-IODINE 10 % EX SWAB: 2.0000 "application " | Freq: Once | CUTANEOUS | Status: DC

## 2017-11-16 MED ORDER — HYDROMORPHONE HCL 1 MG/ML IJ SOLN
0.2500 mg | INTRAMUSCULAR | Status: DC | PRN
  Administered 2017-11-16: 0.5 mg via INTRAVENOUS

## 2017-11-16 MED ORDER — ACETAMINOPHEN 650 MG RE SUPP: 650.0000 mg | RECTAL | Status: DC | PRN

## 2017-11-16 MED ORDER — ACETAMINOPHEN 325 MG PO TABS: 650.0000 mg | ORAL_TABLET | ORAL | Status: DC | PRN

## 2017-11-16 MED ORDER — MIDAZOLAM HCL 2 MG/2ML IJ SOLN
INTRAMUSCULAR | Status: AC
  Filled 2017-11-16: qty 2

## 2017-11-16 MED ORDER — DOCUSATE SODIUM 100 MG PO CAPS
100.0000 mg | ORAL_CAPSULE | Freq: Two times a day (BID) | ORAL | Status: DC
  Administered 2017-11-16 – 2017-11-17 (×2): 100 mg via ORAL
  Filled 2017-11-16 (×2): qty 1

## 2017-11-16 MED ORDER — ONDANSETRON HCL 4 MG/2ML IJ SOLN
INTRAMUSCULAR | Status: DC | PRN
  Administered 2017-11-16: 4 mg via INTRAVENOUS

## 2017-11-16 MED ORDER — DEXAMETHASONE SODIUM PHOSPHATE 10 MG/ML IJ SOLN
10.0000 mg | Freq: Once | INTRAMUSCULAR | Status: AC
  Administered 2017-11-17: 10 mg via INTRAVENOUS
  Filled 2017-11-16: qty 1

## 2017-11-16 MED ORDER — VITAMIN D3 25 MCG (1000 UT) PO CAPS: 1000.0000 [IU] | ORAL_CAPSULE | Freq: Every day | ORAL | Status: DC

## 2017-11-16 MED ORDER — SODIUM CHLORIDE 0.9 % IJ SOLN
INTRAMUSCULAR | Status: DC | PRN
  Administered 2017-11-16: 30 mL via INTRAVENOUS

## 2017-11-16 MED ORDER — FENTANYL CITRATE (PF) 250 MCG/5ML IJ SOLN
INTRAMUSCULAR | Status: AC
  Filled 2017-11-16: qty 5

## 2017-11-16 MED ORDER — 0.9 % SODIUM CHLORIDE (POUR BTL) OPTIME
TOPICAL | Status: DC | PRN
  Administered 2017-11-16: 1000 mL

## 2017-11-16 MED ORDER — METHOCARBAMOL 500 MG PO TABS
500.0000 mg | ORAL_TABLET | Freq: Four times a day (QID) | ORAL | Status: DC | PRN
  Administered 2017-11-16: 500 mg via ORAL
  Filled 2017-11-16: qty 1

## 2017-11-16 MED ORDER — BUPIVACAINE-EPINEPHRINE (PF) 0.5% -1:200000 IJ SOLN
INTRAMUSCULAR | Status: DC | PRN
  Administered 2017-11-16: 30 mL

## 2017-11-16 MED ORDER — PROPOFOL 10 MG/ML IV BOLUS
INTRAVENOUS | Status: DC | PRN
  Administered 2017-11-16: 150 mg via INTRAVENOUS

## 2017-11-16 MED ORDER — LIDOCAINE 2% (20 MG/ML) 5 ML SYRINGE
INTRAMUSCULAR | Status: AC
  Filled 2017-11-16: qty 5

## 2017-11-16 MED ORDER — BENAZEPRIL HCL 5 MG PO TABS
10.0000 mg | ORAL_TABLET | Freq: Every day | ORAL | Status: DC
Start: 2017-11-16 — End: 2017-11-17
  Administered 2017-11-16 – 2017-11-17 (×2): 10 mg via ORAL
  Filled 2017-11-16 (×2): qty 2

## 2017-11-16 MED ORDER — METOCLOPRAMIDE HCL 5 MG/ML IJ SOLN: 5.0000 mg | Freq: Three times a day (TID) | INTRAMUSCULAR | Status: DC | PRN

## 2017-11-16 MED ORDER — HYDROCODONE-ACETAMINOPHEN 5-325 MG PO TABS
ORAL_TABLET | ORAL | Status: AC
  Filled 2017-11-16: qty 2

## 2017-11-16 MED ORDER — CEFAZOLIN SODIUM-DEXTROSE 2-4 GM/100ML-% IV SOLN
2.0000 g | Freq: Four times a day (QID) | INTRAVENOUS | Status: AC
  Administered 2017-11-16 – 2017-11-17 (×2): 2 g via INTRAVENOUS
  Filled 2017-11-16 (×2): qty 100

## 2017-11-16 MED ORDER — KETOROLAC TROMETHAMINE 30 MG/ML IJ SOLN
INTRAMUSCULAR | Status: AC
  Filled 2017-11-16: qty 1

## 2017-11-16 MED ORDER — HYDROMORPHONE HCL 1 MG/ML IJ SOLN
INTRAMUSCULAR | Status: AC
  Administered 2017-11-16: 0.5 mg via INTRAVENOUS
  Filled 2017-11-16: qty 1

## 2017-11-16 MED ORDER — AMLODIPINE BESYLATE 5 MG PO TABS
5.0000 mg | ORAL_TABLET | Freq: Every day | ORAL | Status: DC
  Administered 2017-11-17: 5 mg via ORAL
  Filled 2017-11-16: qty 1

## 2017-11-16 MED ORDER — SENNA 8.6 MG PO TABS
2.0000 | ORAL_TABLET | Freq: Every day | ORAL | Status: DC
  Administered 2017-11-16: 17.2 mg via ORAL
  Filled 2017-11-16: qty 2

## 2017-11-16 MED ORDER — APIXABAN 2.5 MG PO TABS
2.5000 mg | ORAL_TABLET | Freq: Two times a day (BID) | ORAL | Status: DC
  Administered 2017-11-17: 2.5 mg via ORAL
  Filled 2017-11-16: qty 1

## 2017-11-16 MED ORDER — DIPHENHYDRAMINE HCL 12.5 MG/5ML PO ELIX: 12.5000 mg | ORAL_SOLUTION | ORAL | Status: DC | PRN

## 2017-11-16 MED ORDER — ALUM & MAG HYDROXIDE-SIMETH 200-200-20 MG/5ML PO SUSP: 30.0000 mL | ORAL | Status: DC | PRN

## 2017-11-16 MED ORDER — ROCURONIUM BROMIDE 100 MG/10ML IV SOLN
INTRAVENOUS | Status: DC | PRN
  Administered 2017-11-16: 50 mg via INTRAVENOUS

## 2017-11-16 MED ORDER — SODIUM CHLORIDE 0.9 % IV SOLN
1000.0000 mg | Freq: Once | INTRAVENOUS | Status: AC
  Administered 2017-11-16: 1000 mg via INTRAVENOUS
  Filled 2017-11-16: qty 10

## 2017-11-16 MED ORDER — DEXAMETHASONE SODIUM PHOSPHATE 10 MG/ML IJ SOLN
INTRAMUSCULAR | Status: AC
  Filled 2017-11-16: qty 1

## 2017-11-16 MED ORDER — BUPIVACAINE-EPINEPHRINE (PF) 0.5% -1:200000 IJ SOLN
INTRAMUSCULAR | Status: AC
  Filled 2017-11-16: qty 30

## 2017-11-16 MED ORDER — MIDAZOLAM HCL 2 MG/2ML IJ SOLN
INTRAMUSCULAR | Status: DC | PRN
  Administered 2017-11-16: 1 mg via INTRAVENOUS

## 2017-11-16 MED ORDER — VITAMIN D 1000 UNITS PO TABS
1000.0000 [IU] | ORAL_TABLET | Freq: Every day | ORAL | Status: DC
  Administered 2017-11-17: 1000 [IU] via ORAL
  Filled 2017-11-16: qty 1

## 2017-11-16 MED ORDER — SODIUM CHLORIDE 0.9 % IV SOLN: INTRAVENOUS | Status: DC | PRN

## 2017-11-16 MED ORDER — METHOCARBAMOL 1000 MG/10ML IJ SOLN
500.0000 mg | Freq: Four times a day (QID) | INTRAMUSCULAR | Status: DC | PRN
  Filled 2017-11-16: qty 5

## 2017-11-16 MED ORDER — DEXAMETHASONE SODIUM PHOSPHATE 10 MG/ML IJ SOLN
INTRAMUSCULAR | Status: DC | PRN
  Administered 2017-11-16: 10 mg via INTRAVENOUS

## 2017-11-16 MED ORDER — GLYCOPYRROLATE 0.2 MG/ML IJ SOLN
INTRAMUSCULAR | Status: DC | PRN
  Administered 2017-11-16: 0.2 mg via INTRAVENOUS

## 2017-11-16 MED ORDER — ONDANSETRON HCL 4 MG/2ML IJ SOLN: 4.0000 mg | Freq: Four times a day (QID) | INTRAMUSCULAR | Status: DC | PRN

## 2017-11-16 MED ORDER — HYDROCODONE-ACETAMINOPHEN 5-325 MG PO TABS: 1.0000 | ORAL_TABLET | ORAL | Status: DC | PRN

## 2017-11-16 MED ORDER — LIDOCAINE HCL (CARDIAC) 20 MG/ML IV SOLN
INTRAVENOUS | Status: DC | PRN
  Administered 2017-11-16: 100 mg via INTRATRACHEAL

## 2017-11-16 MED ORDER — CHLORHEXIDINE GLUCONATE 4 % EX LIQD: 60.0000 mL | Freq: Once | CUTANEOUS | Status: DC

## 2017-11-16 MED ORDER — PHENOL 1.4 % MT LIQD
1.0000 | OROMUCOSAL | Status: DC | PRN
Start: 2017-11-16 — End: 2017-11-17

## 2017-11-16 MED ORDER — ONDANSETRON HCL 4 MG/2ML IJ SOLN
INTRAMUSCULAR | Status: AC
  Filled 2017-11-16: qty 2

## 2017-11-16 MED ORDER — HYDROMORPHONE HCL 1 MG/ML IJ SOLN: 0.5000 mg | INTRAMUSCULAR | Status: DC | PRN

## 2017-11-16 MED ORDER — FENTANYL CITRATE (PF) 250 MCG/5ML IJ SOLN
INTRAMUSCULAR | Status: DC | PRN
  Administered 2017-11-16 (×5): 50 ug via INTRAVENOUS

## 2017-11-16 MED ORDER — MENTHOL 3 MG MT LOZG: 1.0000 | LOZENGE | OROMUCOSAL | Status: DC | PRN

## 2017-11-16 MED ORDER — LACTATED RINGERS IV SOLN
INTRAVENOUS | Status: DC
  Administered 2017-11-16 (×2): via INTRAVENOUS

## 2017-11-16 MED ORDER — ALBUTEROL SULFATE (2.5 MG/3ML) 0.083% IN NEBU: 3.0000 mL | INHALATION_SOLUTION | Freq: Four times a day (QID) | RESPIRATORY_TRACT | Status: DC | PRN

## 2017-11-16 MED ORDER — ONDANSETRON HCL 4 MG PO TABS
4.0000 mg | ORAL_TABLET | Freq: Four times a day (QID) | ORAL | Status: DC | PRN
Start: 2017-11-16 — End: 2017-11-17

## 2017-11-16 MED ORDER — KETOROLAC TROMETHAMINE 15 MG/ML IJ SOLN
INTRAMUSCULAR | Status: AC
  Filled 2017-11-16: qty 1

## 2017-11-16 MED ORDER — SODIUM CHLORIDE 0.9 % IR SOLN
Status: DC | PRN
  Administered 2017-11-16: 3000 mL

## 2017-11-16 SURGICAL SUPPLY — 54 items
ALCOHOL ISOPROPYL (RUBBING) (MISCELLANEOUS) ×2 IMPLANT
BLADE CLIPPER SURG (BLADE) IMPLANT
CAPT HIP TOTAL 2 ×2 IMPLANT
CHLORAPREP W/TINT 26ML (MISCELLANEOUS) ×2 IMPLANT
COVER SURGICAL LIGHT HANDLE (MISCELLANEOUS) ×2 IMPLANT
DERMABOND ADVANCED (GAUZE/BANDAGES/DRESSINGS) ×1
DERMABOND ADVANCED .7 DNX12 (GAUZE/BANDAGES/DRESSINGS) ×1 IMPLANT
DRAPE C-ARM 42X72 X-RAY (DRAPES) ×2 IMPLANT
DRAPE STERI IOBAN 125X83 (DRAPES) ×2 IMPLANT
DRAPE U-SHAPE 47X51 STRL (DRAPES) ×6 IMPLANT
DRSG AQUACEL AG ADV 3.5X10 (GAUZE/BANDAGES/DRESSINGS) ×2 IMPLANT
ELECT BLADE 4.0 EZ CLEAN MEGAD (MISCELLANEOUS) ×2
ELECT REM PT RETURN 9FT ADLT (ELECTROSURGICAL) ×2
ELECTRODE BLDE 4.0 EZ CLN MEGD (MISCELLANEOUS) ×1 IMPLANT
ELECTRODE REM PT RTRN 9FT ADLT (ELECTROSURGICAL) ×1 IMPLANT
EVACUATOR 1/8 PVC DRAIN (DRAIN) IMPLANT
GLOVE BIO SURGEON STRL SZ 6 (GLOVE) ×2 IMPLANT
GLOVE BIO SURGEON STRL SZ8.5 (GLOVE) ×4 IMPLANT
GLOVE BIOGEL M 8.0 STRL (GLOVE) ×2 IMPLANT
GLOVE BIOGEL PI IND STRL 8.5 (GLOVE) ×2 IMPLANT
GLOVE BIOGEL PI INDICATOR 8.5 (GLOVE) ×2
GLOVE SURG SS PI 6.0 STRL IVOR (GLOVE) ×2 IMPLANT
GOWN STRL REUS W/ TWL LRG LVL3 (GOWN DISPOSABLE) ×2 IMPLANT
GOWN STRL REUS W/TWL 2XL LVL3 (GOWN DISPOSABLE) ×4 IMPLANT
GOWN STRL REUS W/TWL LRG LVL3 (GOWN DISPOSABLE) ×2
HANDPIECE INTERPULSE COAX TIP (DISPOSABLE) ×1
HOOD PEEL AWAY FACE SHEILD DIS (HOOD) ×4 IMPLANT
KIT BASIN OR (CUSTOM PROCEDURE TRAY) ×2 IMPLANT
KIT ROOM TURNOVER OR (KITS) ×2 IMPLANT
MANIFOLD NEPTUNE II (INSTRUMENTS) ×2 IMPLANT
MARKER SKIN DUAL TIP RULER LAB (MISCELLANEOUS) ×4 IMPLANT
NEEDLE SPNL 18GX3.5 QUINCKE PK (NEEDLE) ×2 IMPLANT
NS IRRIG 1000ML POUR BTL (IV SOLUTION) ×2 IMPLANT
PACK TOTAL JOINT (CUSTOM PROCEDURE TRAY) ×2 IMPLANT
PACK UNIVERSAL I (CUSTOM PROCEDURE TRAY) ×2 IMPLANT
PAD ARMBOARD 7.5X6 YLW CONV (MISCELLANEOUS) ×4 IMPLANT
SAW OSC TIP CART 19.5X105X1.3 (SAW) ×2 IMPLANT
SEALER BIPOLAR AQUA 6.0 (INSTRUMENTS) ×2 IMPLANT
SET HNDPC FAN SPRY TIP SCT (DISPOSABLE) ×1 IMPLANT
SOL PREP POV-IOD 4OZ 10% (MISCELLANEOUS) ×2 IMPLANT
SUT ETHIBOND NAB CT1 #1 30IN (SUTURE) ×4 IMPLANT
SUT MNCRL AB 3-0 PS2 18 (SUTURE) ×2 IMPLANT
SUT MON AB 2-0 CT1 36 (SUTURE) ×2 IMPLANT
SUT VIC AB 1 CT1 27 (SUTURE) ×1
SUT VIC AB 1 CT1 27XBRD ANBCTR (SUTURE) ×1 IMPLANT
SUT VIC AB 2-0 CT1 27 (SUTURE) ×1
SUT VIC AB 2-0 CT1 TAPERPNT 27 (SUTURE) ×1 IMPLANT
SUT VLOC 180 0 24IN GS25 (SUTURE) ×2 IMPLANT
SYR 50ML LL SCALE MARK (SYRINGE) ×2 IMPLANT
TOWEL OR 17X24 6PK STRL BLUE (TOWEL DISPOSABLE) ×2 IMPLANT
TOWEL OR 17X26 10 PK STRL BLUE (TOWEL DISPOSABLE) ×2 IMPLANT
TRAY CATH 16FR W/PLASTIC CATH (SET/KITS/TRAYS/PACK) IMPLANT
TRAY FOLEY CATH SILVER 16FR (SET/KITS/TRAYS/PACK) IMPLANT
WATER STERILE IRR 1000ML POUR (IV SOLUTION) ×6 IMPLANT

## 2017-11-16 NOTE — Evaluation (Signed)
Physical Therapy Evaluation Patient Details Name: Noah Ray MRN: [id number] DOB: 08-Aug-1932 Today's Date: 11/16/2017   History of Present Illness  Pt s/p anterior rt THR. PMH - arthritis, copd  Clinical Impression  Pt admitted with above diagnosis and presents to PT with functional limitations due to deficits listed below (See PT problem list). Pt needs skilled PT to maximize independence and safety to allow discharge to home with family. Pt doing well post-op and expect he will continue to make good progress.     Follow Up Recommendations DC plan and follow up therapy as arranged by surgeon    Equipment Recommendations  None recommended by PT    Recommendations for Other Services       Precautions / Restrictions Precautions Precautions: None Restrictions Weight Bearing Restrictions: Yes RLE Weight Bearing: Weight bearing as tolerated      Mobility  Bed Mobility Overal bed mobility: Modified Independent Bed Mobility: Supine to Sit     Supine to sit: Modified independent (Device/Increase time);HOB elevated     General bed mobility comments: Incr time  Transfers Overall transfer level: Needs assistance Equipment used: Rolling walker (2 wheeled) Transfers: Sit to/from Stand Sit to Stand: Min guard         General transfer comment: Assist for safety  Ambulation/Gait Ambulation/Gait assistance: Min guard Ambulation Distance (Feet): 130 Feet Assistive device: Rolling walker (2 wheeled) Gait Pattern/deviations: Step-through pattern;Decreased step length - right;Decreased stance time - right;Trunk flexed Gait velocity: decr Gait velocity interpretation: Below normal speed for age/gender General Gait Details: Assist for safety. Verbal cues to step into the walker with RLE  Stairs            Wheelchair Mobility    Modified Rankin (Stroke Patients Only)       Balance Overall balance assessment: No apparent balance deficits (not formally assessed)                                            Pertinent Vitals/Pain Pain Assessment: Faces Faces Pain Scale: Hurts a little bit Pain Location: rt hip Pain Descriptors / Indicators: Guarding Pain Intervention(s): Limited activity within patient's tolerance;Monitored during session    [place] expects to be discharged to:: Private residence Living Arrangements: Spouse/significant other;Children Available Help at Discharge: Family;Available 24 hours/day Type of Home: House Home Access: Stairs to enter Entrance Stairs-Rails: Right Entrance Stairs-Number of Steps: 2-3 Home Layout: One level Home Equipment: Walker - 2 wheels;Cane - single point;Wheelchair - manual(lift chair)      Prior Function Level of Independence: Independent with assistive device(s)         Comments: Amb with rolling walker. Was working until recently     [employer]        Extremity/Trunk Assessment   Upper Extremity Assessment Upper Extremity Assessment: Overall WFL for tasks assessed    Lower Extremity Assessment Lower Extremity Assessment: RLE deficits/detail RLE Deficits / Details: Decr strength as expected post op       Communication   Communication: No difficulties  Cognition Arousal/Alertness: Awake/alert Behavior During Therapy: WFL for tasks assessed/performed Overall Cognitive Status: Within Functional Limits for tasks assessed                                        General Comments  Exercises     Assessment/Plan    PT Assessment Patient needs continued PT services  PT Problem List Decreased strength;Decreased mobility;Decreased knowledge of use of DME       PT Treatment Interventions DME instruction;Gait training;Stair training;Therapeutic exercise;Therapeutic activities;Functional mobility training;Patient/family education    PT Goals (Current goals can be found in the Care Plan section)  Acute Rehab PT Goals Patient  Stated Goal: return home PT Goal Formulation: With patient Time For Goal Achievement: 11/20/17 Potential to Achieve Goals: Good    Frequency 7X/week   Barriers to discharge        Co-evaluation               AM-PAC PT "6 Clicks" Daily Activity  Outcome Measure Difficulty turning over in bed (including adjusting bedclothes, sheets and blankets)?: A Little Difficulty moving from lying on back to sitting on the side of the bed? : A Little Difficulty sitting down on and standing up from a chair with arms (e.g., wheelchair, bedside commode, etc,.)?: Unable Help needed moving to and from a bed to chair (including a wheelchair)?: A Little Help needed walking in hospital room?: A Little Help needed climbing 3-5 steps with a railing? : A Little 6 Click Score: 16    End of Session Equipment Utilized During Treatment: Gait belt Activity Tolerance: Patient tolerated treatment well Patient left: in chair;with call bell/phone within reach;with family/visitor present Nurse Communication: Mobility status PT Visit Diagnosis: Other abnormalities of gait and mobility (R26.89)    Time: 2244-9753 PT Time Calculation (min) (ACUTE ONLY): 27 min   Charges:   PT Evaluation $PT Eval Low Complexity: 1 Low PT Treatments $Gait Training: 8-22 mins   PT G CodesMarland Kitchen        [facility] [place] 11/16/2017, 5:48 PM

## 2017-11-16 NOTE — Progress Notes (Signed)
Orthopedic Tech Progress Note Patient Details:  Noah Ray 05-25-1932 [id number]  Patient ID: [name], male   DOB: 21-Apr-1932, 84 y.o.   MRN: [id number] Pt cant have ohf due to age restrictions  [name] 11/16/2017, 11:05 PM

## 2017-11-16 NOTE — Anesthesia Procedure Notes (Addendum)
Procedure Name: Intubation Date/Time: 11/16/2017 12:02 PM Performed by: Lance Coon, CRNA Pre-anesthesia Checklist: Patient identified, Emergency Drugs available, Suction available, Patient being monitored and Timeout performed Patient Re-evaluated:Patient Re-evaluated prior to induction Oxygen Delivery Method: Circle system utilized Preoxygenation: Pre-oxygenation with 100% oxygen Induction Type: IV induction Ventilation: Mask ventilation without difficulty Laryngoscope Size: Miller and 3 Grade View: Grade I Tube type: Oral Tube size: 7.5 mm Number of attempts: 1 Airway Equipment and Method: Stylet Placement Confirmation: ETT inserted through vocal cords under direct vision,  positive ETCO2 and breath sounds checked- equal and bilateral Secured at: 21 cm Tube secured with: Tape Dental Injury: Teeth and Oropharynx as per pre-operative assessment

## 2017-11-16 NOTE — Transfer of Care (Signed)
Immediate Anesthesia Transfer of Care Note  Patient: Noah Ray  Procedure(s) Performed: TOTAL HIP ARTHROPLASTY ANTERIOR APPROACH (Right )  Patient Location: PACU  Anesthesia Type:General  Level of Consciousness: awake and patient cooperative  Airway & Oxygen Therapy: Patient Spontanous Breathing  Post-op Assessment: Report given to RN and Post -op Vital signs reviewed and stable  Post vital signs: Reviewed and stable  Last Vitals:  Vitals:   11/16/17 0956  BP: (!) 163/69  Pulse: (!) 57  Resp: 18  Temp: 36.4 C  SpO2: 100%    Last Pain:  Vitals:   11/16/17 0956  TempSrc: Oral         Complications: No apparent anesthesia complications

## 2017-11-16 NOTE — Anesthesia Preprocedure Evaluation (Signed)
Anesthesia Evaluation  Patient identified by MRN, date of birth, ID band Patient awake    Reviewed: Allergy & Precautions, NPO status , Patient's Chart, lab work & pertinent test results  Airway Mallampati: II  TM Distance: >3 FB Neck ROM: Full    Dental   Pulmonary COPD, former smoker,    breath sounds clear to auscultation       Cardiovascular hypertension, Pt. on medications  Rhythm:Regular Rate:Normal     Neuro/Psych negative neurological ROS     GI/Hepatic negative GI ROS, Neg liver ROS,   Endo/Other  negative endocrine ROS  Renal/GU Renal InsufficiencyRenal disease     Musculoskeletal  (+) Arthritis ,   Abdominal   Peds  Hematology negative hematology ROS (+)   Anesthesia Other Findings   Reproductive/Obstetrics                             Lab Results  Component Value Date   WBC 5.9 11/11/2017   HGB 15.4 11/11/2017   HCT 44.1 11/11/2017   MCV 88.9 11/11/2017   PLT 293 11/11/2017   Lab Results  Component Value Date   CREATININE 1.50 (H) 11/11/2017   BUN 24 (H) 11/11/2017   NA 138 11/11/2017   K 4.2 11/11/2017   CL 106 11/11/2017   CO2 20 (L) 11/11/2017   No results found for: INR, PROTIME  Anesthesia Physical Anesthesia Plan  ASA: II  Anesthesia Plan: Spinal   Post-op Pain Management:    Induction: Intravenous  PONV Risk Score and Plan: 2 and Ondansetron, Dexamethasone, Treatment may vary due to age or medical condition and Propofol infusion  Airway Management Planned: Natural Airway and Simple Face Mask  Additional Equipment:   Intra-op Plan:   Post-operative Plan:   Informed Consent: I have reviewed the patients History and Physical, chart, labs and discussed the procedure including the risks, benefits and alternatives for the proposed anesthesia with the patient or authorized representative who has indicated his/her understanding and acceptance.      Plan Discussed with:   Anesthesia Plan Comments:         Anesthesia Quick Evaluation

## 2017-11-16 NOTE — Discharge Instructions (Signed)
OPERATIVE REPORT  SURGEON: Rod Can, MD   ASSISTANT: Ky Barban, RNFA.  PREOPERATIVE DIAGNOSIS: Right hip arthritis with protrusio deformity.   POSTOPERATIVE DIAGNOSIS: Right hip arthritis with protrusio deformity.   PROCEDURE: Right total hip arthroplasty, anterior approach.   IMPLANTS: DePuy Tri Lock stem, size 9, std offset. DePuy Pinnacle Cup, size 60 mm. DePuy Altrx liner, size 36 by 60 mm, neutral. DePuy Biolox ceramic head ball, size 36 + 5 mm.  ANESTHESIA:  Spinal  ESTIMATED BLOOD LOSS:-300 mL    ANTIBIOTICS: 2 g Ancef.  DRAINS: None.  COMPLICATIONS: None.   CONDITION: PACU - hemodynamically stable.Marland Kitchen   BRIEF CLINICAL NOTE: Noah Ray is a 84 y.o. male with a long-standing history of Right hip arthritis. After failing conservative management, the patient was indicated for total hip arthroplasty. The risks, benefits, and alternatives to the procedure were explained, and the patient elected to proceed.  PROCEDURE IN DETAIL: Surgical site was marked by myself. Spinal anesthesia was obtained in the pre-op holding area. Once inside the operative room, a foley catheter was inserted. The patient was then positioned on the Hana table. All bony prominences were well padded. The hip was prepped and draped in the normal sterile surgical fashion. A time-out was called verifying side and site of surgery. The patient received IV antibiotics within 60 minutes of beginning the procedure.  The direct anterior approach to the hip was performed through the Hueter interval. Lateral femoral circumflex vessels were treated with the Auqumantys. The anterior capsule was exposed and an inverted T capsulotomy was made.The femoral neck cut was made to the level of the templated cut. A corkscrew was placed into the head and the head was removed. The femoral head was found to have eburnated bone. The head was passed to the back table and was measured.  Acetabular exposure was  achieved, and the pulvinar and labrum were excised. There was a protrusio deformity with a narrow introitus. I carefully debrided the soft tissue from the medial wall of the acetabulum using a 48 mm reamer. Sequental reaming of the acetabulum was then performed up to a size 59 mm reamer, taking care to keep the hip center low and lateral. There was a small cyst in the superior acetabulum, which I curetted to bleeding bone. Bone grafting was not required. A 60 mm cup was then opened and impacted into place at approximately 40 degrees of abduction and 20 degrees of anteversion. The cup achieved excellent press fit. The final polyethylene liner was impacted into place and acetabular osteophytes were removed.   I then gained femoral exposure taking care to protect the abductors and greater trochanter. This was performed using standard external rotation, extension, and adduction. The capsule was peeled off the inner aspect of the greater trochanter, taking care to preserve the short external rotators. A cookie cutter was used to enter the femoral canal, and then the femoral canal finder was placed. Sequential broaching was performed up to a size 9. Calcar planer was used on the femoral neck remnant. I paced a std offset neck and a trial head ball. The hip was reduced. Leg lengths and offset were checked fluoroscopically. The hip was dislocated and trial components were removed. The final implants were placed, and the hip was reduced.  Fluoroscopy was used to confirm component position and leg lengths. At 90 degrees of external rotation and full extension, the hip was stable to an anterior directed force.  Please note that the leg was lengthened a few millimeters  for the sake of stability.  The leg lengths will be equalized with surgery on the contralateral side.  The wound was copiously irrigated wit normal saline. Marcaine solution was injected into the periarticular soft tissue. The wound was closed  in layers using #1 Vicryl and V-Loc for the fascia, 2-0 Vicryl for the subcutaneous fat, 2-0 Monocryl for the deep dermal layer, 3-0 running Monocryl subcuticular stitch, and Dermabond for the skin. Once the glue was fully dried, an Aquacell Ag dressing was applied. The patient was transported to the recovery room in stable condition. Sponge, needle, and instrument counts were correct at the end of the case x2. The patient tolerated the procedure well and there were no known complications.

## 2017-11-16 NOTE — Anesthesia Postprocedure Evaluation (Signed)
Anesthesia Post Note  Patient: Noah Ray  Procedure(s) Performed: TOTAL HIP ARTHROPLASTY ANTERIOR APPROACH (Right )     Patient location during evaluation: PACU Anesthesia Type: Spinal Level of consciousness: awake and alert Pain management: pain level controlled Vital Signs Assessment: post-procedure vital signs reviewed and stable Respiratory status: spontaneous breathing and respiratory function stable Cardiovascular status: blood pressure returned to baseline and stable Postop Assessment: spinal receding Anesthetic complications: no    Last Vitals:  Vitals:   11/16/17 1515 11/16/17 1530  BP: (!) 144/99 (!) 141/77  Pulse: 70 64  Resp: (!) 21 13  Temp:    SpO2: 98% 93%    Last Pain:  Vitals:   11/16/17 1530  TempSrc:   PainSc: Asleep                 Byren Pankow DANIEL

## 2017-11-16 NOTE — Interval H&P Note (Signed)
History and Physical Interval Note:  11/16/2017 11:31 AM  [name]  has presented today for surgery, with the diagnosis of Degenerative joint disease right hip  The various methods of treatment have been discussed with the patient and family. After consideration of risks, benefits and other options for treatment, the patient has consented to  Procedure(s): TOTAL HIP ARTHROPLASTY ANTERIOR APPROACH (Right) as a surgical intervention .  The patient's history has been reviewed, patient examined, no change in status, stable for surgery.  I have reviewed the patient's chart and labs.  Questions were answered to the patient's satisfaction.     Hilton Cork Zahniya Zellars

## 2017-11-17 ENCOUNTER — Encounter (HOSPITAL_COMMUNITY): Payer: Self-pay | Admitting: Orthopedic Surgery

## 2017-11-17 ENCOUNTER — Other Ambulatory Visit: Payer: Self-pay

## 2017-11-17 LAB — BASIC METABOLIC PANEL
Anion gap: 12 (ref 5–15)
BUN: 19 mg/dL (ref 6–20)
CHLORIDE: 101 mmol/L (ref 101–111)
CO2: 21 mmol/L — AB (ref 22–32)
Calcium: 9.5 mg/dL (ref 8.9–10.3)
Creatinine, Ser: 1.5 mg/dL — ABNORMAL HIGH (ref 0.61–1.24)
GFR calc Af Amer: 48 mL/min — ABNORMAL LOW (ref >60)
GFR calc non Af Amer: 41 mL/min — ABNORMAL LOW (ref >60)
GLUCOSE: 148 mg/dL — AB (ref 65–99)
POTASSIUM: 4.9 mmol/L (ref 3.5–5.1)
Sodium: 134 mmol/L — ABNORMAL LOW (ref 135–145)

## 2017-11-17 LAB — CBC
HEMATOCRIT: 36.2 % — AB (ref 39.0–52.0)
HEMOGLOBIN: 12.6 g/dL — AB (ref 13.0–17.0)
MCH: 30.5 pg (ref 26.0–34.0)
MCHC: 34.8 g/dL (ref 30.0–36.0)
MCV: 87.7 fL (ref 78.0–100.0)
Platelets: 288 10*3/uL (ref 150–400)
RBC: 4.13 MIL/uL — AB (ref 4.22–5.81)
RDW: 13.1 % (ref 11.5–15.5)
WBC: 14.5 10*3/uL — AB (ref 4.0–10.5)

## 2017-11-17 MED ORDER — DOCUSATE SODIUM 100 MG PO CAPS: 100.0000 mg | ORAL_CAPSULE | Freq: Two times a day (BID) | ORAL | 0 refills | Status: AC

## 2017-11-17 MED ORDER — ASPIRIN 81 MG PO TABS: 81.0000 mg | ORAL_TABLET | Freq: Two times a day (BID) | ORAL | 1 refills | Status: DC

## 2017-11-17 MED ORDER — HYDROCODONE-ACETAMINOPHEN 5-325 MG PO TABS: 1.0000 | ORAL_TABLET | Freq: Four times a day (QID) | ORAL | 0 refills | Status: DC | PRN

## 2017-11-17 MED ORDER — PNEUMOCOCCAL VAC POLYVALENT 25 MCG/0.5ML IJ INJ
0.5000 mL | INJECTION | INTRAMUSCULAR | Status: AC | PRN
  Administered 2017-11-17: 0.5 mL via INTRAMUSCULAR
  Filled 2017-11-17: qty 0.5

## 2017-11-17 MED ORDER — SENNA 8.6 MG PO TABS: 2.0000 | ORAL_TABLET | Freq: Every day | ORAL | 0 refills | Status: DC

## 2017-11-17 MED ORDER — ONDANSETRON HCL 4 MG PO TABS: 4.0000 mg | ORAL_TABLET | Freq: Four times a day (QID) | ORAL | 0 refills | Status: DC | PRN

## 2017-11-17 NOTE — Op Note (Signed)
OPERATIVE REPORT  SURGEON: Rod Can, MD   ASSISTANT: Ky Barban, RNFA.  PREOPERATIVE DIAGNOSIS: Right hip arthritis.   POSTOPERATIVE DIAGNOSIS: Right hip arthritis.   PROCEDURE: Right total hip arthroplasty, anterior approach.   IMPLANTS: DePuy Tri Lock stem, size 9, std offset. DePuy Pinnacle Cup, size 60 mm. DePuy Altrx liner, size 36 by 60 mm, neutral. DePuy Biolox ceramic head ball, size 36 + 5 mm.  ANESTHESIA:  Spinal  ESTIMATED BLOOD LOSS:-300 mL    ANTIBIOTICS: 2 g Ancef.  DRAINS: None.  COMPLICATIONS: None.   CONDITION: PACU - hemodynamically stable.   BRIEF CLINICAL NOTE: Noah Ray is a 83 y.o. male with a long-standing history of Right hip arthritis. After failing conservative management, the patient was indicated for total hip arthroplasty. The risks, benefits, and alternatives to the procedure were explained, and the patient elected to proceed.  PROCEDURE IN DETAIL: Surgical site was marked by myself in the pre-op holding area. Once inside the operating room, spinal anesthesia was obtained, and a foley catheter was inserted. The patient was then positioned on the Hana table. All bony prominences were well padded. The hip was prepped and draped in the normal sterile surgical fashion. A time-out was called verifying side and site of surgery. The patient received IV antibiotics within 60 minutes of beginning the procedure.  The direct anterior approach to the hip was performed through the Hueter interval. Lateral femoral circumflex vessels were treated with the Auqumantys. The anterior capsule was exposed and an inverted T capsulotomy was made.The femoral neck cut was made to the level of the templated cut. A corkscrew was placed into the head and the head was removed. The femoral head was found to have eburnated bone. The head was passed to the back table and was measured.  Acetabular exposure was achieved, and the pulvinar and labrum were  excised. Sequential reaming of the acetabulum was then performed up to a size 59 mm reamer. A 60 mm cup was then opened and impacted into place at approximately 40 degrees of abduction and 20 degrees of anteversion. The final polyethylene liner was impacted into place and acetabular osteophytes were removed.   I then gained femoral exposure taking care to protect the abductors and greater trochanter. This was performed using standard external rotation, extension, and adduction. The capsule was peeled off the inner aspect of the greater trochanter, taking care to preserve the short external rotators. A cookie cutter was used to enter the femoral canal, and then the femoral canal finder was placed. Sequential broaching was performed up to a size 9. Calcar planer was used on the femoral neck remnant. I placed a std offset neck and a trial head ball. The hip was reduced. Leg lengths and offset were checked fluoroscopically. The hip was dislocated and trial components were removed. The final implants were placed, and the hip was reduced.  Fluoroscopy was used to confirm component position and leg lengths. At 90 degrees of external rotation and full extension, the hip was stable to an anterior directed force.  The wound was copiously irrigated with normal saline using pulse lavage. Marcaine solution was injected into the periarticular soft tissue. The wound was closed in layers using #1 Vicryl and V-Loc for the fascia, 2-0 Vicryl for the subcutaneous fat, 2-0 Monocryl for the deep dermal layer, 3-0 running Monocryl subcuticular stitch, and Dermabond for the skin. Once the glue was fully dried, an Aquacell Ag dressing was applied. The patient was transported to the recovery room in  stable condition. Sponge, needle, and instrument counts were correct at the end of the case x2. The patient tolerated the procedure well and there were no known complications.

## 2017-11-17 NOTE — Progress Notes (Signed)
Physical Therapy Treatment Patient Details Name: Noah Ray MRN: [id number] DOB: July 29, 1932 Today's Date: 11/17/2017    History of Present Illness Pt s/p anterior rt THR. PMH - arthritis, copd    PT Comments    Patient is progressing well toward mobility goals and tolerated gait training and standing bilat LE exercises. Pt given HEP handout and reviewed. Current plan remains appropriate.    Follow Up Recommendations  DC plan and follow up therapy as arranged by surgeon     Equipment Recommendations  None recommended by PT    Recommendations for Other Services       Precautions / Restrictions Precautions Precautions: None Restrictions Weight Bearing Restrictions: Yes RLE Weight Bearing: Weight bearing as tolerated    Mobility  Bed Mobility Overal bed mobility: Modified Independent Bed Mobility: Supine to Sit     Supine to sit: Modified independent (Device/Increase time);HOB elevated     General bed mobility comments: Incr time  Transfers Overall transfer level: Needs assistance Equipment used: Rolling walker (2 wheeled) Transfers: Sit to/from Stand Sit to Stand: Supervision         General transfer comment: supervision for safety  Ambulation/Gait Ambulation/Gait assistance: Supervision Ambulation Distance (Feet): 250 Feet Assistive device: Rolling walker (2 wheeled) Gait Pattern/deviations: Step-through pattern;Decreased stance time - right;Trunk flexed;Decreased weight shift to right Gait velocity: decr   General Gait Details: cues for posture and proximity to RW; pt with leg length discrepency   Stairs            Wheelchair Mobility    Modified Rankin (Stroke Patients Only)       Balance Overall balance assessment: No apparent balance deficits (not formally assessed)                                          Cognition Arousal/Alertness: Awake/alert Behavior During Therapy: WFL for tasks assessed/performed Overall  Cognitive Status: Within Functional Limits for tasks assessed                                        Exercises Total Joint Exercises Hip ABduction/ADduction: AROM;Both;10 reps;Standing Knee Flexion: AROM;Both;10 reps;Standing Marching in Standing: AROM;Both;10 reps;Standing Standing Hip Extension: AROM;Both;10 reps;Standing    General Comments        Pertinent Vitals/Pain Pain Assessment: No/denies pain    Home Living                      Prior Function            PT Goals (current goals can now be found in the care plan section) Acute Rehab PT Goals Patient Stated Goal: return home PT Goal Formulation: With patient Time For Goal Achievement: 11/20/17 Potential to Achieve Goals: Good Progress towards PT goals: Progressing toward goals    Frequency    7X/week      PT Plan Current plan remains appropriate    Co-evaluation              AM-PAC PT "6 Clicks" Daily Activity  Outcome Measure  Difficulty turning over in bed (including adjusting bedclothes, sheets and blankets)?: A Little Difficulty moving from lying on back to sitting on the side of the bed? : A Little Difficulty sitting down on and standing up from a chair with arms (e.g.,  wheelchair, bedside commode, etc,.)?: Unable Help needed moving to and from a bed to chair (including a wheelchair)?: A Little Help needed walking in hospital room?: A Little Help needed climbing 3-5 steps with a railing? : A Little 6 Click Score: 16    End of Session Equipment Utilized During Treatment: Gait belt Activity Tolerance: Patient tolerated treatment well Patient left: in chair;with call bell/phone within reach;with family/visitor present Nurse Communication: Mobility status PT Visit Diagnosis: Other abnormalities of gait and mobility (R26.89)     Time: 1840-3754 PT Time Calculation (min) (ACUTE ONLY): 23 min  Charges:  $Gait Training: 8-22 mins $Therapeutic Exercise: 8-22  mins                    G Codes:       Earney Navy, PTA Pager: [phone]     Darliss Cheney 11/17/2017, 9:41 AM

## 2017-11-17 NOTE — Progress Notes (Signed)
Removed IV, provided discharge education/instructions, all questions and concerns addressed, Pt not in distress, discharged home with belongings. 

## 2017-11-17 NOTE — Progress Notes (Signed)
Physical Therapy Treatment Patient Details Name: Noah Ray MRN: [id number] DOB: 1932/08/22 Today's Date: 11/17/2017    History of Present Illness Pt s/p anterior rt THR. PMH - arthritis, copd    PT Comments    Patient is making good progress with PT.  From a mobility standpoint anticipate patient will be ready for DC home when medically ready.    Follow Up Recommendations  DC plan and follow up therapy as arranged by surgeon     Equipment Recommendations  None recommended by PT    Recommendations for Other Services       Precautions / Restrictions Precautions Precautions: None Restrictions Weight Bearing Restrictions: Yes RLE Weight Bearing: Weight bearing as tolerated    Mobility  Bed Mobility Overal bed mobility: Independent Bed Mobility: Supine to Sit;Sit to Supine           General bed mobility comments: HOB flat   Transfers Overall transfer level: Modified independent Equipment used: Rolling walker (2 wheeled) Transfers: Sit to/from Stand           General transfer comment: RW for support upon standing  Ambulation/Gait Ambulation/Gait assistance: Supervision Ambulation Distance (Feet): 350 Feet Assistive device: Rolling walker (2 wheeled) Gait Pattern/deviations: Step-through pattern;Trunk flexed Gait velocity: decr   General Gait Details: cues for safe use of AD and posture   Stairs            Wheelchair Mobility    Modified Rankin (Stroke Patients Only)       Balance Overall balance assessment: No apparent balance deficits (not formally assessed)                                          Cognition Arousal/Alertness: Awake/alert Behavior During Therapy: WFL for tasks assessed/performed Overall Cognitive Status: Within Functional Limits for tasks assessed                                        Exercises Total Joint Exercises Ankle Circles/Pumps: AROM;Both;10 reps Quad Sets: AROM;Both;10  reps Short Arc Quad: AROM;Right;15 reps Heel Slides: AROM;Right;10 reps Hip ABduction/ADduction: AROM;10 reps;Right;Supine Long Arc Quad: AROM;Right;15 reps;Seated    General Comments        Pertinent Vitals/Pain Pain Assessment: No/denies pain    Home Living                      Prior Function            PT Goals (current goals can now be found in the care plan section) Acute Rehab PT Goals Patient Stated Goal: return home PT Goal Formulation: With patient Time For Goal Achievement: 11/20/17 Potential to Achieve Goals: Good Progress towards PT goals: Progressing toward goals    Frequency    7X/week      PT Plan Current plan remains appropriate    Co-evaluation              AM-PAC PT "6 Clicks" Daily Activity  Outcome Measure  Difficulty turning over in bed (including adjusting bedclothes, sheets and blankets)?: A Little Difficulty moving from lying on back to sitting on the side of the bed? : A Little Difficulty sitting down on and standing up from a chair with arms (e.g., wheelchair, bedside commode, etc,.)?: A Little Help needed moving to and  from a bed to chair (including a wheelchair)?: A Little Help needed walking in hospital room?: A Little Help needed climbing 3-5 steps with a railing? : A Little 6 Click Score: 18    End of Session Equipment Utilized During Treatment: Gait belt Activity Tolerance: Patient tolerated treatment well Patient left: in chair;with call bell/phone within reach;with family/visitor present Nurse Communication: Mobility status PT Visit Diagnosis: Other abnormalities of gait and mobility (R26.89)     Time: 1610-9604 PT Time Calculation (min) (ACUTE ONLY): 20 min  Charges:  $Gait Training: 8-22 mins                    G Codes:       Earney Navy, PTA Pager: [phone]     Darliss Cheney 11/17/2017, 1:43 PM

## 2017-11-17 NOTE — Progress Notes (Signed)
    Subjective:  Patient reports pain as mild to moderate.  Denies N/V/CP/SOB. (+) void. Wants to go home.  Objective:   VITALS:   Vitals:   11/16/17 1702 11/16/17 2035 11/17/17 0009 11/17/17 0354  BP: (!) 158/72 (!) 143/68 122/60 (!) 126/54  Pulse: 76 70 62 (!) 55  Resp: 16 17 19 18   Temp: 98 F (36.7 C) 97.6 F (36.4 C) 98.2 F (36.8 C) 98.4 F (36.9 C)  TempSrc: Oral Oral Oral Oral  SpO2: 97% 97% 95% 100%    NAD ABD soft Sensation intact distally Intact pulses distally Dorsiflexion/Plantar flexion intact Incision: dressing C/D/I Compartment soft   Lab Results  Component Value Date   WBC 14.5 (H) 11/17/2017   HGB 12.6 (L) 11/17/2017   HCT 36.2 (L) 11/17/2017   MCV 87.7 11/17/2017   PLT 288 11/17/2017   BMET    Component Value Date/Time   NA 134 (L) 11/17/2017 0557   K 4.9 11/17/2017 0557   CL 101 11/17/2017 0557   CO2 21 (L) 11/17/2017 0557   GLUCOSE 148 (H) 11/17/2017 0557   BUN 19 11/17/2017 0557   CREATININE 1.50 (H) 11/17/2017 0557   CALCIUM 9.5 11/17/2017 0557   GFRNONAA 41 (L) 11/17/2017 0557   GFRAA 48 (L) 11/17/2017 0557     Assessment/Plan: 1 Day Post-Op   Principal Problem:   Osteoarthritis of right hip   WBAT with walker DVT ppx: apixaban in house --> home on ASA, SCDs, TEDS PO pain control PT/OT Dispo: D/C home with HEP    Hilton Cork Equilla Que 11/17/2017, 11:27 AM   Rod Can, MD Cell [phone]

## 2017-11-17 NOTE — Discharge Summary (Signed)
Physician Discharge Summary  Patient ID: Noah Ray MRN: [id number] DOB/AGE: August 03, 1932 84 y.o.  Admit date: 11/16/2017 Discharge date: 11/17/2017  Admission Diagnoses:  Osteoarthritis of right hip  Discharge Diagnoses:  Principal Problem:   Osteoarthritis of right hip   Past Medical History:  Diagnosis Date  . Arthritis    "all over" (11/17/2017)  . Collapsed lung 1980s   "spontaneous"  . COPD (chronic obstructive pulmonary disease) ([place])   . Dyspnea    occasional with exertion  . Essential hypertension   . Osteoarthritis of both knees   . Pneumonia    "twice" (11/17/2017)    Surgeries: Procedure(s): TOTAL HIP ARTHROPLASTY ANTERIOR APPROACH on 11/16/2017   Consultants (if any):   Discharged Condition: Improved  Hospital Course: Noah Ray is an 84 y.o. male who was admitted 11/16/2017 with a diagnosis of Osteoarthritis of right hip and went to the operating room on 11/16/2017 and underwent the above named procedures.    He was given perioperative antibiotics:  Anti-infectives (From admission, onward)   Start     Dose/Rate Route Frequency Ordered Stop   11/16/17 1800  ceFAZolin (ANCEF) IVPB 2g/100 mL premix     2 g 200 mL/hr over 30 Minutes Intravenous Every 6 hours 11/16/17 1654 11/17/17 0039   11/16/17 1030  ceFAZolin (ANCEF) IVPB 2g/100 mL premix     2 g 200 mL/hr over 30 Minutes Intravenous To ShortStay Surgical 11/13/17 1102 11/16/17 1210    .  He was given sequential compression devices, early ambulation, and ASA for DVT prophylaxis.  He benefited maximally from the hospital stay and there were no complications.    Recent vital signs:  Vitals:   11/17/17 0354 11/17/17 1300  BP: (!) 126/54 (!) 151/96  Pulse: (!) 55 78  Resp: 18 18  Temp: 98.4 F (36.9 C) 98.5 F (36.9 C)  SpO2: 100% 100%    Recent laboratory studies:  Lab Results  Component Value Date   HGB 12.6 (L) 11/17/2017   HGB 15.4 11/11/2017   Lab Results  Component Value Date   WBC 14.5  (H) 11/17/2017   PLT 288 11/17/2017   No results found for: INR Lab Results  Component Value Date   NA 134 (L) 11/17/2017   K 4.9 11/17/2017   CL 101 11/17/2017   CO2 21 (L) 11/17/2017   BUN 19 11/17/2017   CREATININE 1.50 (H) 11/17/2017   GLUCOSE 148 (H) 11/17/2017    Discharge Medications:   Allergies as of 11/17/2017      Reactions   Shellfish Allergy Shortness Of Breath   Short of breath, hand cramping, ONLY crab legs      Medication List    STOP taking these medications   acetaminophen 650 MG CR tablet Commonly known as:  TYLENOL     TAKE these medications   amLODipine 5 MG tablet Commonly known as:  NORVASC Take 5 mg by mouth daily.   aspirin 81 MG tablet Take 1 tablet (81 mg total) by mouth 2 (two) times daily after a meal.   benazepril 10 MG tablet Commonly known as:  LOTENSIN Take 10 mg by mouth daily.   docusate sodium 100 MG capsule Commonly known as:  COLACE Take 1 capsule (100 mg total) by mouth 2 (two) times daily.   Fish Oil 1000 MG Caps Take 1,000 mg by mouth 2 (two) times daily.   HYDROcodone-acetaminophen 5-325 MG tablet Commonly known as:  NORCO/VICODIN Take 1-2 tablets by mouth every 6 (six) hours as  needed (hip pain).   meloxicam 15 MG tablet Commonly known as:  MOBIC Take 15 mg by mouth daily.   MOVE FREE PO Take 1 tablet by mouth daily.   ondansetron 4 MG tablet Commonly known as:  ZOFRAN Take 1 tablet (4 mg total) by mouth every 6 (six) hours as needed for nausea.   senna 8.6 MG Tabs tablet Commonly known as:  SENOKOT Take 2 tablets (17.2 mg total) by mouth at bedtime.   VENTOLIN HFA 108 (90 Base) MCG/ACT inhaler Generic drug:  albuterol Inhale 1 puff into the lungs every 6 (six) hours as needed for wheezing or shortness of breath.   Vitamin D3 1000 units Caps Take 1,000 Units by mouth daily.       Diagnostic Studies: Dg Pelvis Portable  Result Date: 11/16/2017 CLINICAL DATA:  Right hip replacement EXAM: PORTABLE  PELVIS 1-2 VIEWS COMPARISON:  05/08/2017 FINDINGS: Right hip replacement satisfactory position alignment. No fracture or complication Advanced degenerative change left hip joint. IMPRESSION: Satisfactory right hip replacement without acute abnormality. Electronically Signed   By: Franchot Gallo M.D.   On: 11/16/2017 15:12   Dg C-arm 1-60 Min  Result Date: 11/16/2017 CLINICAL DATA:  84 year old male post right hip replacement. Initial encounter. EXAM: OPERATIVE right HIP (WITH PELVIS IF PERFORMED) single VIEW TECHNIQUE: Fluoroscopic spot image(s) were submitted for interpretation post-operatively. Fluoroscopic time: 25 seconds. COMPARISON:  None. FINDINGS: Post total right hip replacement which appears in satisfactory position on frontal projection. IMPRESSION: Post total right hip replacement. Electronically Signed   By: Genia Del M.D.   On: 11/16/2017 14:08   Dg Hip Operative Unilat W Or W/o Pelvis Right  Result Date: 11/16/2017 CLINICAL DATA:  84 year old male post right hip replacement. Initial encounter. EXAM: OPERATIVE right HIP (WITH PELVIS IF PERFORMED) single VIEW TECHNIQUE: Fluoroscopic spot image(s) were submitted for interpretation post-operatively. Fluoroscopic time: 25 seconds. COMPARISON:  None. FINDINGS: Post total right hip replacement which appears in satisfactory position on frontal projection. IMPRESSION: Post total right hip replacement. Electronically Signed   By: Genia Del M.D.   On: 11/16/2017 14:08    Disposition: 01-Home or Self Care  Discharge Instructions    Call MD / Call 911   Complete by:  As directed    If you experience chest pain or shortness of breath, CALL 911 and be transported to the hospital emergency room.  If you develope a fever above 101 F, pus (white drainage) or increased drainage or redness at the wound, or calf pain, call your surgeon's office.   Constipation Prevention   Complete by:  As directed    Drink plenty of fluids.  Prune juice may be  helpful.  You may use a stool softener, such as Colace (over the counter) 100 mg twice a day.  Use MiraLax (over the counter) for constipation as needed.   Diet - low sodium heart healthy   Complete by:  As directed    Driving restrictions   Complete by:  As directed    No driving for 6 weeks   Increase activity slowly as tolerated   Complete by:  As directed    Lifting restrictions   Complete by:  As directed    No lifting for 6 weeks   TED hose   Complete by:  As directed    Use stockings (TED hose) for 2 weeks on both leg(s).  You may remove them at night for sleeping.      Follow-up Information  Leif Loflin, Aaron Edelman, MD. Schedule an appointment as soon as possible for a visit in 2 weeks.   Specialty:  Orthopedic Surgery Why:  For wound re-check Contact information: [address] [place] [address] [phone]            Signed: Hilton Cork Vena Bassinger 11/17/2017, 4:19 PM

## 2017-12-01 DIAGNOSIS — Z96641 Presence of right artificial hip joint: Secondary | ICD-10-CM | POA: Diagnosis not present

## 2017-12-01 DIAGNOSIS — Z471 Aftercare following joint replacement surgery: Secondary | ICD-10-CM | POA: Diagnosis not present

## 2017-12-01 DIAGNOSIS — M1612 Unilateral primary osteoarthritis, left hip: Secondary | ICD-10-CM | POA: Diagnosis not present

## 2017-12-18 ENCOUNTER — Ambulatory Visit: Payer: Self-pay | Admitting: Orthopedic Surgery

## 2017-12-28 DIAGNOSIS — G8929 Other chronic pain: Secondary | ICD-10-CM | POA: Diagnosis not present

## 2017-12-28 DIAGNOSIS — K5900 Constipation, unspecified: Secondary | ICD-10-CM | POA: Diagnosis not present

## 2017-12-28 DIAGNOSIS — K219 Gastro-esophageal reflux disease without esophagitis: Secondary | ICD-10-CM | POA: Diagnosis not present

## 2017-12-28 DIAGNOSIS — J449 Chronic obstructive pulmonary disease, unspecified: Secondary | ICD-10-CM | POA: Diagnosis not present

## 2017-12-28 DIAGNOSIS — Z8249 Family history of ischemic heart disease and other diseases of the circulatory system: Secondary | ICD-10-CM | POA: Diagnosis not present

## 2017-12-28 DIAGNOSIS — Z809 Family history of malignant neoplasm, unspecified: Secondary | ICD-10-CM | POA: Diagnosis not present

## 2017-12-28 DIAGNOSIS — K08109 Complete loss of teeth, unspecified cause, unspecified class: Secondary | ICD-10-CM | POA: Diagnosis not present

## 2017-12-28 DIAGNOSIS — Z79891 Long term (current) use of opiate analgesic: Secondary | ICD-10-CM | POA: Diagnosis not present

## 2017-12-28 DIAGNOSIS — R69 Illness, unspecified: Secondary | ICD-10-CM | POA: Diagnosis not present

## 2017-12-28 DIAGNOSIS — I10 Essential (primary) hypertension: Secondary | ICD-10-CM | POA: Diagnosis not present

## 2017-12-29 DIAGNOSIS — Z471 Aftercare following joint replacement surgery: Secondary | ICD-10-CM | POA: Diagnosis not present

## 2017-12-29 DIAGNOSIS — Z96641 Presence of right artificial hip joint: Secondary | ICD-10-CM | POA: Diagnosis not present

## 2018-01-06 NOTE — Pre-Procedure Instructions (Signed)
Noah Ray  01/06/2018      [facility] [address] [place] [address] Phone: [phone] Fax: [phone]    Your procedure is scheduled on April 8  Report to [facility] at 530 A.M.  Call this number if you have problems the morning of surgery:  [phone]   Remember:  Do not eat food or drink liquids after midnight.  Take these medicines the morning of surgery with A SIP OF WATER    Albuterol inhaler, if needed- bring your inhalers with you on the day of surgery  Hydrocodone (Norco) if needed amlodipine (Norvasc)  Stop taking aspirin as directed by your Dr. Stop taking BC's, Goody's, Herbal medications, Fish Oil, Vitamins, Aleve, Ibuprofen, Advil, Motrin   Do not wear jewelry, make-up or nail polish.  Do not wear lotions, powders, or perfumes, or deodorant.  Do not shave 48 hours prior to surgery.  Men may shave face and neck.  Do not bring valuables to the hospital.  [facility] is not responsible for any belongings or valuables.  Contacts, dentures or bridgework may not be worn into surgery.  Leave your suitcase in the car.  After surgery it may be brought to your room.  For patients admitted to the hospital, discharge time will be determined by your treatment team.  Patients discharged the day of surgery will not be allowed to drive home.   Special instructions:   [facility]- Preparing For Surgery  Before surgery, you can play an important role. Because skin is not sterile, your skin needs to be as free of germs as possible. You can reduce the number of germs on your skin by washing with CHG (chlorahexidine gluconate) Soap before surgery.  CHG is an antiseptic cleaner which kills germs and bonds with the skin to continue killing germs even after washing.  Please do not use if you have an allergy to CHG or antibacterial soaps. If your skin becomes reddened/irritated stop  using the CHG.  Do not shave (including legs and underarms) for at least 48 hours prior to first CHG shower. It is OK to shave your face.  Please follow these instructions carefully.   1. Shower the NIGHT BEFORE SURGERY and the MORNING OF SURGERY with CHG.   2. If you chose to wash your hair, wash your hair first as usual with your normal shampoo.  3. After you shampoo, rinse your hair and body thoroughly to remove the shampoo.  4. Use CHG as you would any other liquid soap. You can apply CHG directly to the skin and wash gently with a scrungie or a clean washcloth.   5. Apply the CHG Soap to your body ONLY FROM THE NECK DOWN.  Do not use on open wounds or open sores. Avoid contact with your eyes, ears, mouth and genitals (private parts). Wash Face and genitals (private parts)  with your normal soap.  6. Wash thoroughly, paying special attention to the area where your surgery will be performed.  7. Thoroughly rinse your body with warm water from the neck down.  8. DO NOT shower/wash with your normal soap after using and rinsing off the CHG Soap.  9. Pat yourself dry with a CLEAN TOWEL.  10. Wear CLEAN PAJAMAS to bed the night before surgery, wear comfortable clothes the morning of surgery  11. Place CLEAN SHEETS on your bed the night of your first shower and DO NOT SLEEP  WITH PETS.    Day of Surgery: Do not apply any deodorants/lotions. Please wear clean clothes to the hospital/surgery center.      Please read over the following fact sheets that you were given. Pain Booklet, Coughing and Deep Breathing, MRSA Information and Surgical Site Infection Prevention

## 2018-01-07 ENCOUNTER — Encounter (HOSPITAL_COMMUNITY)
Admission: RE | Admit: 2018-01-07 | Discharge: 2018-01-07 | Disposition: A | Discharge status: Home / Self Care | Payer: Medicare HMO | Source: Ambulatory Visit | Attending: Orthopedic Surgery | Admitting: Orthopedic Surgery

## 2018-01-07 ENCOUNTER — Encounter (HOSPITAL_COMMUNITY): Payer: Self-pay

## 2018-01-07 ENCOUNTER — Other Ambulatory Visit: Payer: Self-pay

## 2018-01-07 DIAGNOSIS — K219 Gastro-esophageal reflux disease without esophagitis: Secondary | ICD-10-CM | POA: Diagnosis not present

## 2018-01-07 DIAGNOSIS — Z79899 Other long term (current) drug therapy: Secondary | ICD-10-CM | POA: Insufficient documentation

## 2018-01-07 DIAGNOSIS — Z0183 Encounter for blood typing: Secondary | ICD-10-CM | POA: Insufficient documentation

## 2018-01-07 DIAGNOSIS — M1990 Unspecified osteoarthritis, unspecified site: Secondary | ICD-10-CM | POA: Insufficient documentation

## 2018-01-07 DIAGNOSIS — Z87891 Personal history of nicotine dependence: Secondary | ICD-10-CM | POA: Diagnosis not present

## 2018-01-07 DIAGNOSIS — Z96641 Presence of right artificial hip joint: Secondary | ICD-10-CM | POA: Insufficient documentation

## 2018-01-07 DIAGNOSIS — J449 Chronic obstructive pulmonary disease, unspecified: Secondary | ICD-10-CM | POA: Diagnosis not present

## 2018-01-07 DIAGNOSIS — Z01818 Encounter for other preprocedural examination: Secondary | ICD-10-CM | POA: Insufficient documentation

## 2018-01-07 DIAGNOSIS — I10 Essential (primary) hypertension: Secondary | ICD-10-CM | POA: Insufficient documentation

## 2018-01-07 DIAGNOSIS — Z01812 Encounter for preprocedural laboratory examination: Secondary | ICD-10-CM | POA: Diagnosis not present

## 2018-01-07 HISTORY — DX: Gastro-esophageal reflux disease without esophagitis: K21.9

## 2018-01-07 LAB — BASIC METABOLIC PANEL
Anion gap: 11 (ref 5–15)
BUN: 15 mg/dL (ref 6–20)
CALCIUM: 10.1 mg/dL (ref 8.9–10.3)
CO2: 19 mmol/L — ABNORMAL LOW (ref 22–32)
CREATININE: 1.34 mg/dL — AB (ref 0.61–1.24)
Chloride: 107 mmol/L (ref 101–111)
GFR calc Af Amer: 55 mL/min — ABNORMAL LOW (ref >60)
GFR calc non Af Amer: 47 mL/min — ABNORMAL LOW (ref >60)
GLUCOSE: 106 mg/dL — AB (ref 65–99)
Potassium: 4.2 mmol/L (ref 3.5–5.1)
Sodium: 137 mmol/L (ref 135–145)

## 2018-01-07 LAB — CBC
HEMATOCRIT: 40.4 % (ref 39.0–52.0)
Hemoglobin: 14.1 g/dL (ref 13.0–17.0)
MCH: 30.7 pg (ref 26.0–34.0)
MCHC: 34.9 g/dL (ref 30.0–36.0)
MCV: 88 fL (ref 78.0–100.0)
Platelets: 337 10*3/uL (ref 150–400)
RBC: 4.59 MIL/uL (ref 4.22–5.81)
RDW: 12.5 % (ref 11.5–15.5)
WBC: 5.4 10*3/uL (ref 4.0–10.5)

## 2018-01-07 LAB — SURGICAL PCR SCREEN
MRSA, PCR: NEGATIVE
Staphylococcus aureus: NEGATIVE

## 2018-01-07 LAB — TYPE AND SCREEN
ABO/RH(D): B POS
Antibody Screen: NEGATIVE

## 2018-01-07 NOTE — Progress Notes (Signed)
PCP - Stoney Bang Cardiologist - patient denies  Chest x-ray - n/a EKG - 05/11/2017    Cardio reviewed, no atrial fib Stress Test - 06/11/2017 ECHO - 06/11/2017 Cardiac Cath - patient denies  Sleep Study - patient denies

## 2018-01-07 NOTE — Pre-Procedure Instructions (Signed)
Grayson Pfefferle  01/07/2018      [facility] [address] [place] [address] Phone: [phone] Fax: [phone]    Your procedure is scheduled on Monday, April 8th   Report to [facility] at 530 A.M.             (posted surgery time 7:30a - 10a)   Call this number if you have problems the morning of surgery:  [phone]   Remember:   Do not eat food or drink liquids after midnight, Sunday.   Take these medicines the morning of surgery with A SIP OF WATER    Albuterol inhaler, if needed- bring your inhalers with you on the day of surgery  Hydrocodone (Norco) if needed amlodipine (Norvasc)  Stop taking aspirin as directed by your Dr. Stop taking BC's, Goody's, Herbal medications, Fish Oil, Vitamins, Aleve, Ibuprofen, Advil, Motrin   Do not wear jewelry - NO rings or watches.  Do not wear lotions, colognes or deodorant.             Men may shave face and neck.  Do not bring valuables to the hospital.  [facility] is not responsible for any belongings or valuables.  Contacts, dentures or bridgework may not be worn into surgery.  Leave your suitcase in the car.  After surgery it may be brought to your room.  For patients admitted to the hospital, discharge time will be determined by your treatment team.    Special instructions:   [facility]- Preparing For Surgery  Before surgery, you can play an important role. Because skin is not sterile, your skin needs to be as free of germs as possible. You can reduce the number of germs on your skin by washing with CHG (chlorahexidine gluconate) Soap before surgery.  CHG is an antiseptic cleaner which kills germs and bonds with the skin to continue killing germs even after washing.  Please do not use if you have an allergy to CHG or antibacterial soaps. If your skin becomes reddened/irritated stop using the CHG.  Do not shave (including legs  and underarms) for at least 48 hours prior to first CHG shower. It is OK to shave your face.  Please follow these instructions carefully.   1. Shower the NIGHT BEFORE SURGERY and the MORNING OF SURGERY with CHG.   2. If you chose to wash your hair, wash your hair first as usual with your normal shampoo.  3. After you shampoo, rinse your hair and body thoroughly to remove the shampoo.  4. Use CHG as you would any other liquid soap. You can apply CHG directly to the skin and wash gently with a scrungie or a clean washcloth.   5. Apply the CHG Soap to your body ONLY FROM THE NECK DOWN.  Do not use on open wounds or open sores. Avoid contact with your eyes, ears, mouth and genitals (private parts). Wash Face and genitals (private parts)  with your normal soap.  6. Wash thoroughly, paying special attention to the area where your surgery will be performed.  7. Thoroughly rinse your body with warm water from the neck down.  8. DO NOT shower/wash with your normal soap after using and rinsing off the CHG Soap.  9. Pat yourself dry with a CLEAN TOWEL.  10. Wear CLEAN PAJAMAS to bed the night before surgery, wear comfortable clothes the morning of surgery  11. Place CLEAN SHEETS on  your bed the night of your first shower and DO NOT SLEEP WITH PETS.    Day of Surgery: Do not apply any deodorants/lotions. Please wear clean clothes to the hospital/surgery center.      Please read over the following fact sheets that you were given. Pain Booklet, Coughing and Deep Breathing, MRSA Information and Surgical Site Infection Prevention

## 2018-01-08 NOTE — Progress Notes (Signed)
Anesthesia Chart Review: Patient is a 84 year old male scheduled for left THA on 01/18/2018 by Dr. Rod Can.  History includes former smoker, hypertension, COPD, spontaneous pneumothorax 1980s, osteoarthritis, GERD, exertional dyspnea, right THA 11/16/17.   PCP is Dr. Volanda Napoleon.  He is not routinely followed by cardiology, but saw Dr. Rozann Lesches in 05/2017 for evaluation for bradycardia and exertional dyspnea. Bradycardia improved after stopping b-blocker. Following stress, echo, and Holter monitor, he felt patient could continue follow-up with PCP.  Meds include albuterol, amlodipine, aspirin 81 mg (not taking), benazepril, Norco, fish oil, Carafate.  BP (!) 154/68   Pulse 77   Temp (!) 36.4 C   Resp 20   Ht 6\' 3"  (1.905 m)   Wt 200 lb 8 oz (90.9 kg)   SpO2 99%   BMI 25.06 kg/m   EKG 01/07/2018: Normal sinus rhythm. (Of note, 05/11/17 tracing scanned under Results tab read as "atrial fibrillation"; however tracing was reviewed by Dr. Domenic Polite as "sinus bradycardia at 51 bpm with decreased R wave progression and nonspecific T-wave changes.")  Nuclear stress test 06/11/17:  No diagnostic ST segment changes to indicate ischemia.  Medium sized, moderate intensity, apical to basal fixed inferior wall defect. Defect intensity does suggest potential scar, although normal wall motion in this area would argue more for diaphragmatic attenuation. No significant ischemia.  This is a low risk study.  Nuclear stress EF: 72%.  Echo 06/11/17: Study Conclusions - Left ventricle: The cavity size was normal. There was mild focal   basal hypertrophy of the septum. Systolic function was normal.   The estimated ejection fraction was in the range of 60% to 65%.   Wall motion was normal; there were no regional wall motion   abnormalities. Doppler parameters are consistent with abnormal   left ventricular relaxation (grade 1 diastolic dysfunction). - Aortic valve: Mildly calcified annulus.  Trileaflet; mildly   calcified leaflets. Mean gradient (S): 4 mm Hg. - Mitral valve: There was mild regurgitation. - Right atrium: Central venous pressure (est): 3 mm Hg. - Atrial septum: No defect or patent foramen ovale was identified. - Tricuspid valve: There was mild regurgitation. - Pulmonary arteries: PA peak pressure: 34 mm Hg (S). - Pericardium, extracardiac: There was no pericardial effusion. Impressions: - Mild basal septal LV hypertrophy with LVEF 60-65% and grade 1   diastolic dysfunction. Mild mitral regurgitation. Mildly   sclerotic aortic valve without stenosis. Mild tricuspid   regurgitation with PASP estimated at 34 mmHg.  48 Hour Holter monitor 06/03/17: 48 hour Holter monitor reviewed. Sinus rhythm present throughout. There were rare PACs and PVCs, ventricular couplet. No evidence of high degree heart block or pauses. Heart rate ranged from 46 bpm up to 103 bpm with average heart rate 66 bpm.  CXR 05/11/17 ([facility]): Findings: The cardiomediastinal silhouette is unchanged and within normal limits.  No airspace consolidation, edema, pleural effusion, or pneumothorax is identified.  Thoracic spondylosis is noted.  Impression: No active cardiopulmonary disease.  Preoperative labs noted. Glucose 106. BUN 15. Cr 1.34 (previously 1.50 11/17/17). CBC WNL. T&S done.   If no acute changes and anticipate that he can proceed as planned.  [facility] Short Stay Center/Anesthesiology Phone [phone] 01/08/2018 5:07 PM

## 2018-01-15 MED ORDER — TRANEXAMIC ACID 1000 MG/10ML IV SOLN
1000.0000 mg | INTRAVENOUS | Status: AC
  Administered 2018-01-18: 1000 mg via INTRAVENOUS
  Filled 2018-01-15: qty 10

## 2018-01-15 MED ORDER — ACETAMINOPHEN 10 MG/ML IV SOLN
1000.0000 mg | INTRAVENOUS | Status: DC
  Filled 2018-01-15: qty 100

## 2018-01-17 NOTE — Anesthesia Preprocedure Evaluation (Signed)
Anesthesia Evaluation  Patient identified by MRN, date of birth, ID band Patient awake    Reviewed: Allergy & Precautions, H&P , Patient's Chart, lab work & pertinent test results, reviewed documented beta blocker date and time   Airway Mallampati: II  TM Distance: >3 FB Neck ROM: full    Dental no notable dental hx.    Pulmonary former smoker,    Pulmonary exam normal breath sounds clear to auscultation       Cardiovascular hypertension,  Rhythm:regular Rate:Normal     Neuro/Psych    GI/Hepatic   Endo/Other    Renal/GU      Musculoskeletal   Abdominal   Peds  Hematology   Anesthesia Other Findings   Reproductive/Obstetrics                             Anesthesia Physical Anesthesia Plan  ASA: II  Anesthesia Plan: Spinal   Post-op Pain Management:    Induction:   PONV Risk Score and Plan: 1 and Dexamethasone, Ondansetron and Treatment may vary due to age or medical condition  Airway Management Planned:   Additional Equipment:   Intra-op Plan:   Post-operative Plan:   Informed Consent: I have reviewed the patients History and Physical, chart, labs and discussed the procedure including the risks, benefits and alternatives for the proposed anesthesia with the patient or authorized representative who has indicated his/her understanding and acceptance.   Dental Advisory Given  Plan Discussed with: CRNA and Surgeon  Anesthesia Plan Comments: (  )        Anesthesia Quick Evaluation

## 2018-01-18 ENCOUNTER — Encounter (HOSPITAL_COMMUNITY)
Admission: RE | Disposition: A | Discharge status: Home / Self Care | Payer: Self-pay | Source: Ambulatory Visit | Attending: Orthopedic Surgery

## 2018-01-18 ENCOUNTER — Inpatient Hospital Stay (HOSPITAL_COMMUNITY): Payer: Medicare HMO

## 2018-01-18 ENCOUNTER — Inpatient Hospital Stay (HOSPITAL_COMMUNITY): Payer: Medicare HMO | Admitting: Anesthesiology

## 2018-01-18 ENCOUNTER — Inpatient Hospital Stay (HOSPITAL_COMMUNITY): Payer: Medicare HMO | Admitting: Vascular Surgery

## 2018-01-18 ENCOUNTER — Inpatient Hospital Stay (HOSPITAL_COMMUNITY)
Admission: RE | Admit: 2018-01-18 | Discharge: 2018-01-19 | DRG: 470 | Disposition: A | Discharge status: Home / Self Care | Payer: Medicare HMO | Source: Ambulatory Visit | Attending: Orthopedic Surgery | Admitting: Orthopedic Surgery

## 2018-01-18 DIAGNOSIS — Z87891 Personal history of nicotine dependence: Secondary | ICD-10-CM

## 2018-01-18 DIAGNOSIS — Z471 Aftercare following joint replacement surgery: Secondary | ICD-10-CM | POA: Diagnosis not present

## 2018-01-18 DIAGNOSIS — E669 Obesity, unspecified: Secondary | ICD-10-CM | POA: Diagnosis not present

## 2018-01-18 DIAGNOSIS — K219 Gastro-esophageal reflux disease without esophagitis: Secondary | ICD-10-CM | POA: Diagnosis present

## 2018-01-18 DIAGNOSIS — Z91013 Allergy to seafood: Secondary | ICD-10-CM

## 2018-01-18 DIAGNOSIS — M1612 Unilateral primary osteoarthritis, left hip: Secondary | ICD-10-CM | POA: Diagnosis not present

## 2018-01-18 DIAGNOSIS — Z96641 Presence of right artificial hip joint: Secondary | ICD-10-CM | POA: Diagnosis present

## 2018-01-18 DIAGNOSIS — I10 Essential (primary) hypertension: Secondary | ICD-10-CM | POA: Diagnosis present

## 2018-01-18 DIAGNOSIS — Z9181 History of falling: Secondary | ICD-10-CM

## 2018-01-18 DIAGNOSIS — J449 Chronic obstructive pulmonary disease, unspecified: Secondary | ICD-10-CM | POA: Diagnosis present

## 2018-01-18 DIAGNOSIS — Z96642 Presence of left artificial hip joint: Secondary | ICD-10-CM | POA: Diagnosis not present

## 2018-01-18 DIAGNOSIS — Z09 Encounter for follow-up examination after completed treatment for conditions other than malignant neoplasm: Secondary | ICD-10-CM

## 2018-01-18 DIAGNOSIS — Z419 Encounter for procedure for purposes other than remedying health state, unspecified: Secondary | ICD-10-CM

## 2018-01-18 HISTORY — PX: TOTAL HIP ARTHROPLASTY: SHX124

## 2018-01-18 SURGERY — ARTHROPLASTY, HIP, TOTAL, ANTERIOR APPROACH
Anesthesia: Spinal | Site: Hip | Laterality: Left

## 2018-01-18 MED ORDER — PHENOL 1.4 % MT LIQD: 1.0000 | OROMUCOSAL | Status: DC | PRN

## 2018-01-18 MED ORDER — ACETAMINOPHEN 325 MG PO TABS: 325.0000 mg | ORAL_TABLET | Freq: Four times a day (QID) | ORAL | Status: DC | PRN

## 2018-01-18 MED ORDER — CEFAZOLIN SODIUM-DEXTROSE 2-4 GM/100ML-% IV SOLN
2.0000 g | Freq: Four times a day (QID) | INTRAVENOUS | Status: AC
  Administered 2018-01-18 (×2): 2 g via INTRAVENOUS
  Filled 2018-01-18 (×2): qty 100

## 2018-01-18 MED ORDER — SODIUM CHLORIDE 0.9 % IJ SOLN
INTRAMUSCULAR | Status: DC | PRN
  Administered 2018-01-18: 30 mL via INTRAVENOUS

## 2018-01-18 MED ORDER — MIDAZOLAM HCL 5 MG/5ML IJ SOLN
INTRAMUSCULAR | Status: DC | PRN
  Administered 2018-01-18 (×2): 1 mg via INTRAVENOUS

## 2018-01-18 MED ORDER — PROPOFOL 500 MG/50ML IV EMUL
INTRAVENOUS | Status: DC | PRN
  Administered 2018-01-18: 80 ug/kg/min via INTRAVENOUS

## 2018-01-18 MED ORDER — MORPHINE SULFATE (PF) 2 MG/ML IV SOLN: 0.5000 mg | INTRAVENOUS | Status: DC | PRN

## 2018-01-18 MED ORDER — 0.9 % SODIUM CHLORIDE (POUR BTL) OPTIME
TOPICAL | Status: DC | PRN
  Administered 2018-01-18: 1000 mL

## 2018-01-18 MED ORDER — PHENYLEPHRINE HCL 10 MG/ML IJ SOLN
INTRAMUSCULAR | Status: DC | PRN
  Administered 2018-01-18 (×3): 40 ug via INTRAVENOUS

## 2018-01-18 MED ORDER — CHLORHEXIDINE GLUCONATE 4 % EX LIQD: 60.0000 mL | Freq: Once | CUTANEOUS | Status: DC

## 2018-01-18 MED ORDER — ASPIRIN 81 MG PO CHEW
81.0000 mg | CHEWABLE_TABLET | Freq: Two times a day (BID) | ORAL | Status: DC
  Administered 2018-01-18 – 2018-01-19 (×2): 81 mg via ORAL
  Filled 2018-01-18 (×2): qty 1

## 2018-01-18 MED ORDER — DEXAMETHASONE SODIUM PHOSPHATE 4 MG/ML IJ SOLN
INTRAMUSCULAR | Status: DC | PRN
  Administered 2018-01-18: 10 mg via INTRAVENOUS

## 2018-01-18 MED ORDER — ACETAMINOPHEN 10 MG/ML IV SOLN
INTRAVENOUS | Status: DC | PRN
  Administered 2018-01-18: 1000 mg via INTRAVENOUS

## 2018-01-18 MED ORDER — HYDROCODONE-ACETAMINOPHEN 5-325 MG PO TABS
1.0000 | ORAL_TABLET | ORAL | Status: DC | PRN
  Administered 2018-01-18: 1 via ORAL
  Filled 2018-01-18: qty 2
  Filled 2018-01-18: qty 1

## 2018-01-18 MED ORDER — DOCUSATE SODIUM 100 MG PO CAPS
100.0000 mg | ORAL_CAPSULE | Freq: Two times a day (BID) | ORAL | Status: DC
  Administered 2018-01-18 – 2018-01-19 (×2): 100 mg via ORAL
  Filled 2018-01-18 (×2): qty 1

## 2018-01-18 MED ORDER — KETOROLAC TROMETHAMINE 30 MG/ML IJ SOLN
INTRAMUSCULAR | Status: DC | PRN
  Administered 2018-01-18: 30 mg via INTRAVENOUS

## 2018-01-18 MED ORDER — METHOCARBAMOL 500 MG PO TABS: 500.0000 mg | ORAL_TABLET | Freq: Four times a day (QID) | ORAL | Status: DC | PRN

## 2018-01-18 MED ORDER — MIDAZOLAM HCL 2 MG/2ML IJ SOLN
INTRAMUSCULAR | Status: AC
  Filled 2018-01-18: qty 2

## 2018-01-18 MED ORDER — METOCLOPRAMIDE HCL 5 MG/ML IJ SOLN: 5.0000 mg | Freq: Three times a day (TID) | INTRAMUSCULAR | Status: DC | PRN

## 2018-01-18 MED ORDER — CEFAZOLIN SODIUM-DEXTROSE 2-4 GM/100ML-% IV SOLN
INTRAVENOUS | Status: AC
  Filled 2018-01-18: qty 100

## 2018-01-18 MED ORDER — BUPIVACAINE-EPINEPHRINE (PF) 0.5% -1:200000 IJ SOLN
INTRAMUSCULAR | Status: DC | PRN
  Administered 2018-01-18: 30 mL via PERINEURAL

## 2018-01-18 MED ORDER — FENTANYL CITRATE (PF) 100 MCG/2ML IJ SOLN
INTRAMUSCULAR | Status: DC | PRN
  Administered 2018-01-18: 50 ug via INTRAVENOUS

## 2018-01-18 MED ORDER — SODIUM CHLORIDE 0.9 % IR SOLN
Status: DC | PRN
  Administered 2018-01-18: 3000 mL

## 2018-01-18 MED ORDER — METOCLOPRAMIDE HCL 5 MG PO TABS: 5.0000 mg | ORAL_TABLET | Freq: Three times a day (TID) | ORAL | Status: DC | PRN

## 2018-01-18 MED ORDER — KETOROLAC TROMETHAMINE 30 MG/ML IJ SOLN
INTRAMUSCULAR | Status: AC
  Filled 2018-01-18: qty 1

## 2018-01-18 MED ORDER — SUCRALFATE 1 G PO TABS
1.0000 g | ORAL_TABLET | Freq: Three times a day (TID) | ORAL | Status: DC
  Administered 2018-01-18 – 2018-01-19 (×5): 1 g via ORAL
  Filled 2018-01-18 (×5): qty 1

## 2018-01-18 MED ORDER — ALUM & MAG HYDROXIDE-SIMETH 200-200-20 MG/5ML PO SUSP: 30.0000 mL | ORAL | Status: DC | PRN

## 2018-01-18 MED ORDER — DEXAMETHASONE SODIUM PHOSPHATE 10 MG/ML IJ SOLN
INTRAMUSCULAR | Status: AC
  Filled 2018-01-18: qty 1

## 2018-01-18 MED ORDER — PHENYLEPHRINE HCL 10 MG/ML IJ SOLN
INTRAVENOUS | Status: DC | PRN
  Administered 2018-01-18: 25 ug/min via INTRAVENOUS

## 2018-01-18 MED ORDER — POLYETHYLENE GLYCOL 3350 17 G PO PACK: 17.0000 g | PACK | Freq: Every day | ORAL | Status: DC | PRN

## 2018-01-18 MED ORDER — ONDANSETRON HCL 4 MG PO TABS: 4.0000 mg | ORAL_TABLET | Freq: Four times a day (QID) | ORAL | Status: DC | PRN

## 2018-01-18 MED ORDER — POVIDONE-IODINE 10 % EX SWAB: 2.0000 "application " | Freq: Once | CUTANEOUS | Status: DC

## 2018-01-18 MED ORDER — PHENYLEPHRINE 40 MCG/ML (10ML) SYRINGE FOR IV PUSH (FOR BLOOD PRESSURE SUPPORT)
PREFILLED_SYRINGE | INTRAVENOUS | Status: AC
  Filled 2018-01-18: qty 10

## 2018-01-18 MED ORDER — DEXAMETHASONE SODIUM PHOSPHATE 10 MG/ML IJ SOLN
10.0000 mg | Freq: Once | INTRAMUSCULAR | Status: AC
  Administered 2018-01-19: 10 mg via INTRAVENOUS
  Filled 2018-01-18: qty 1

## 2018-01-18 MED ORDER — SENNA 8.6 MG PO TABS
1.0000 | ORAL_TABLET | Freq: Two times a day (BID) | ORAL | Status: DC
  Administered 2018-01-18: 8.6 mg via ORAL
  Filled 2018-01-18: qty 1

## 2018-01-18 MED ORDER — ALBUTEROL SULFATE (2.5 MG/3ML) 0.083% IN NEBU: 2.5000 mg | INHALATION_SOLUTION | Freq: Four times a day (QID) | RESPIRATORY_TRACT | Status: DC | PRN

## 2018-01-18 MED ORDER — ONDANSETRON HCL 4 MG/2ML IJ SOLN
INTRAMUSCULAR | Status: AC
  Filled 2018-01-18: qty 2

## 2018-01-18 MED ORDER — ONDANSETRON HCL 4 MG/2ML IJ SOLN: 4.0000 mg | Freq: Four times a day (QID) | INTRAMUSCULAR | Status: DC | PRN

## 2018-01-18 MED ORDER — LACTATED RINGERS IV SOLN
INTRAVENOUS | Status: DC | PRN
  Administered 2018-01-18 (×2): via INTRAVENOUS

## 2018-01-18 MED ORDER — POLYETHYLENE GLYCOL 3350 17 G PO PACK: 17.0000 g | PACK | ORAL | Status: DC

## 2018-01-18 MED ORDER — VITAMIN D 1000 UNITS PO TABS
1000.0000 [IU] | ORAL_TABLET | Freq: Every day | ORAL | Status: DC
  Administered 2018-01-19: 1000 [IU] via ORAL
  Filled 2018-01-18: qty 1

## 2018-01-18 MED ORDER — BENAZEPRIL HCL 10 MG PO TABS
10.0000 mg | ORAL_TABLET | Freq: Every day | ORAL | Status: DC
  Administered 2018-01-19: 10 mg via ORAL
  Filled 2018-01-18 (×2): qty 1

## 2018-01-18 MED ORDER — PHENYLEPHRINE HCL 10 MG/ML IJ SOLN
INTRAMUSCULAR | Status: AC
  Filled 2018-01-18: qty 1

## 2018-01-18 MED ORDER — ONDANSETRON HCL 4 MG/2ML IJ SOLN
INTRAMUSCULAR | Status: DC | PRN
  Administered 2018-01-18: 4 mg via INTRAVENOUS

## 2018-01-18 MED ORDER — DIPHENHYDRAMINE HCL 12.5 MG/5ML PO ELIX: 12.5000 mg | ORAL_SOLUTION | ORAL | Status: DC | PRN

## 2018-01-18 MED ORDER — AMLODIPINE BESYLATE 5 MG PO TABS
5.0000 mg | ORAL_TABLET | Freq: Every day | ORAL | Status: DC
  Administered 2018-01-19: 5 mg via ORAL
  Filled 2018-01-18: qty 1

## 2018-01-18 MED ORDER — METHOCARBAMOL 1000 MG/10ML IJ SOLN
500.0000 mg | Freq: Four times a day (QID) | INTRAMUSCULAR | Status: DC | PRN
  Filled 2018-01-18: qty 5

## 2018-01-18 MED ORDER — KETOROLAC TROMETHAMINE 15 MG/ML IJ SOLN
7.5000 mg | Freq: Four times a day (QID) | INTRAMUSCULAR | Status: AC
  Administered 2018-01-18 – 2018-01-19 (×4): 7.5 mg via INTRAVENOUS
  Filled 2018-01-18 (×4): qty 1

## 2018-01-18 MED ORDER — SODIUM CHLORIDE 0.9 % IV SOLN
INTRAVENOUS | Status: DC
  Administered 2018-01-18: 12:00:00 via INTRAVENOUS

## 2018-01-18 MED ORDER — DEXAMETHASONE SODIUM PHOSPHATE 10 MG/ML IJ SOLN
INTRAMUSCULAR | Status: DC | PRN
  Administered 2018-01-18: 10 mg via INTRAVENOUS

## 2018-01-18 MED ORDER — FENTANYL CITRATE (PF) 250 MCG/5ML IJ SOLN
INTRAMUSCULAR | Status: AC
  Filled 2018-01-18: qty 5

## 2018-01-18 MED ORDER — CEFAZOLIN SODIUM-DEXTROSE 2-4 GM/100ML-% IV SOLN
2.0000 g | INTRAVENOUS | Status: AC
  Administered 2018-01-18: 2 g via INTRAVENOUS

## 2018-01-18 MED ORDER — BUPIVACAINE-EPINEPHRINE (PF) 0.5% -1:200000 IJ SOLN
INTRAMUSCULAR | Status: AC
  Filled 2018-01-18: qty 30

## 2018-01-18 MED ORDER — MENTHOL 3 MG MT LOZG: 1.0000 | LOZENGE | OROMUCOSAL | Status: DC | PRN

## 2018-01-18 MED ORDER — PROPOFOL 10 MG/ML IV BOLUS
INTRAVENOUS | Status: AC
  Filled 2018-01-18: qty 40

## 2018-01-18 SURGICAL SUPPLY — 53 items
ALCOHOL ISOPROPYL (RUBBING) (MISCELLANEOUS) ×2 IMPLANT
BLADE CLIPPER SURG (BLADE) IMPLANT
CAPT HIP TOTAL 2 ×2 IMPLANT
CHLORAPREP W/TINT 26ML (MISCELLANEOUS) ×2 IMPLANT
COVER SURGICAL LIGHT HANDLE (MISCELLANEOUS) ×2 IMPLANT
DERMABOND ADVANCED (GAUZE/BANDAGES/DRESSINGS) ×1
DERMABOND ADVANCED .7 DNX12 (GAUZE/BANDAGES/DRESSINGS) ×1 IMPLANT
DRAPE C-ARM 42X72 X-RAY (DRAPES) ×2 IMPLANT
DRAPE STERI IOBAN 125X83 (DRAPES) ×2 IMPLANT
DRAPE U-SHAPE 47X51 STRL (DRAPES) ×6 IMPLANT
DRSG AQUACEL AG ADV 3.5X10 (GAUZE/BANDAGES/DRESSINGS) ×2 IMPLANT
ELECT BLADE 4.0 EZ CLEAN MEGAD (MISCELLANEOUS) ×2
ELECT PENCIL ROCKER SW 15FT (MISCELLANEOUS) ×2 IMPLANT
ELECT REM PT RETURN 9FT ADLT (ELECTROSURGICAL) ×2
ELECTRODE BLDE 4.0 EZ CLN MEGD (MISCELLANEOUS) ×1 IMPLANT
ELECTRODE REM PT RTRN 9FT ADLT (ELECTROSURGICAL) ×1 IMPLANT
EVACUATOR 1/8 PVC DRAIN (DRAIN) IMPLANT
GLOVE BIO SURGEON STRL SZ8.5 (GLOVE) ×6 IMPLANT
GLOVE BIOGEL PI IND STRL 8.5 (GLOVE) ×2 IMPLANT
GLOVE BIOGEL PI INDICATOR 8.5 (GLOVE) ×2
GOWN STRL REUS W/ TWL LRG LVL3 (GOWN DISPOSABLE) ×2 IMPLANT
GOWN STRL REUS W/TWL 2XL LVL3 (GOWN DISPOSABLE) ×2 IMPLANT
GOWN STRL REUS W/TWL LRG LVL3 (GOWN DISPOSABLE) ×2
HANDPIECE INTERPULSE COAX TIP (DISPOSABLE) ×1
HOOD PEEL AWAY FACE SHEILD DIS (HOOD) ×4 IMPLANT
HOOD SURGICAL BLUE (PROTECTIVE WEAR) ×4 IMPLANT
KIT BASIN OR (CUSTOM PROCEDURE TRAY) ×2 IMPLANT
KIT TURNOVER KIT B (KITS) ×2 IMPLANT
MANIFOLD NEPTUNE II (INSTRUMENTS) ×2 IMPLANT
MARKER SKIN DUAL TIP RULER LAB (MISCELLANEOUS) ×4 IMPLANT
NEEDLE SPNL 18GX3.5 QUINCKE PK (NEEDLE) ×2 IMPLANT
NS IRRIG 1000ML POUR BTL (IV SOLUTION) ×2 IMPLANT
PACK TOTAL JOINT (CUSTOM PROCEDURE TRAY) ×2 IMPLANT
PACK UNIVERSAL I (CUSTOM PROCEDURE TRAY) ×2 IMPLANT
PAD ARMBOARD 7.5X6 YLW CONV (MISCELLANEOUS) ×4 IMPLANT
SAW OSC TIP CART 19.5X105X1.3 (SAW) ×2 IMPLANT
SEALER BIPOLAR AQUA 6.0 (INSTRUMENTS) IMPLANT
SET HNDPC FAN SPRY TIP SCT (DISPOSABLE) ×1 IMPLANT
SOL PREP POV-IOD 4OZ 10% (MISCELLANEOUS) ×2 IMPLANT
SUT ETHIBOND NAB CT1 #1 30IN (SUTURE) ×4 IMPLANT
SUT MNCRL AB 3-0 PS2 18 (SUTURE) ×2 IMPLANT
SUT MON AB 2-0 CT1 36 (SUTURE) ×2 IMPLANT
SUT VIC AB 1 CT1 27 (SUTURE) ×1
SUT VIC AB 1 CT1 27XBRD ANBCTR (SUTURE) ×1 IMPLANT
SUT VIC AB 2-0 CT1 27 (SUTURE) ×1
SUT VIC AB 2-0 CT1 TAPERPNT 27 (SUTURE) ×1 IMPLANT
SUT VLOC 180 0 24IN GS25 (SUTURE) ×2 IMPLANT
SYR 50ML LL SCALE MARK (SYRINGE) ×2 IMPLANT
TOWEL OR 17X24 6PK STRL BLUE (TOWEL DISPOSABLE) ×2 IMPLANT
TOWEL OR 17X26 10 PK STRL BLUE (TOWEL DISPOSABLE) ×2 IMPLANT
TRAY CATH 16FR W/PLASTIC CATH (SET/KITS/TRAYS/PACK) IMPLANT
TRAY FOLEY CATH SILVER 16FR (SET/KITS/TRAYS/PACK) IMPLANT
WATER STERILE IRR 1000ML POUR (IV SOLUTION) ×6 IMPLANT

## 2018-01-18 NOTE — Evaluation (Signed)
Physical Therapy Evaluation Patient Details Name: Noah Ray MRN: [id number] DOB: 1931/10/27 Today's Date: 01/18/2018   History of Present Illness  Pt is an 84 y/o male s/p elective L THA, direct anterior approach. PMH includes COPD and R THA.   Clinical Impression  Pt is s/p surgery above with deficits below. Pt tolerated gait training well, however, slightly unsteady requiring min guard A with RW. Reviewed supine HEP with pt. Will continue to follow acutely to maximize functional mobility independence and safety.     Follow Up Recommendations Follow surgeon's recommendation for DC plan and follow-up therapies;Supervision for mobility/OOB    Equipment Recommendations  None recommended by PT    Recommendations for Other Services       Precautions / Restrictions Precautions Precautions: None Precaution Comments: Reviewed supine HEP with pt.  Restrictions Weight Bearing Restrictions: Yes LLE Weight Bearing: Weight bearing as tolerated      Mobility  Bed Mobility Overal bed mobility: Needs Assistance Bed Mobility: Supine to Sit     Supine to sit: Supervision     General bed mobility comments: Supervision for safety. Increased time required.   Transfers Overall transfer level: Needs assistance Equipment used: Rolling walker (2 wheeled) Transfers: Sit to/from Stand Sit to Stand: Min assist;From elevated surface         General transfer comment: Min a for steadying assist from elevated surface. Verbal cues for safe hand placement.   Ambulation/Gait Ambulation/Gait assistance: Min guard Ambulation Distance (Feet): 125 Feet Assistive device: Rolling walker (2 wheeled) Gait Pattern/deviations: Step-to pattern;Step-through pattern;Decreased step length - right;Decreased step length - left;Decreased weight shift to left;Trunk flexed Gait velocity: Decreased  Gait velocity interpretation: Below normal speed for age/gender General Gait Details: Slow, antalgic gait. Able  to progress to step through gait pattern. Slightly unsteady requiring min guard A with use of RW. Also required cues for proximity to device and safe use of RW.   Stairs            Wheelchair Mobility    Modified Rankin (Stroke Patients Only)       Balance Overall balance assessment: Needs assistance Sitting-balance support: No upper extremity supported;Feet supported Sitting balance-Leahy Scale: Good     Standing balance support: Bilateral upper extremity supported;During functional activity Standing balance-Leahy Scale: Poor Standing balance comment: Reliant on BUE support                              Pertinent Vitals/Pain Pain Assessment: No/denies pain    Home Living Family/patient expects to be discharged to:: Private residence Living Arrangements: Spouse/significant other Available Help at Discharge: Family Type of Home: House Home Access: Ramped entrance     Home Layout: One level Home Equipment: Bedside commode;Walker - 2 wheels;Cane - single point      Prior Function Level of Independence: Independent               Hand Dominance   Dominant Hand: Right    Extremity/Trunk Assessment   Upper Extremity Assessment Upper Extremity Assessment: Defer to OT evaluation    Lower Extremity Assessment Lower Extremity Assessment: LLE deficits/detail LLE Deficits / Details: SEnsory in tact. Deficits consistent with post op pain and weakness. Able to perform ther ex below.     Cervical / Trunk Assessment Cervical / Trunk Assessment: Normal  Communication   Communication: No difficulties  Cognition Arousal/Alertness: Awake/alert Behavior During Therapy: WFL for tasks assessed/performed Overall Cognitive Status: Within Functional Limits  for tasks assessed                                        General Comments General comments (skin integrity, edema, etc.): Pt's wife present during session.     Exercises Total Joint  Exercises Ankle Circles/Pumps: AROM;Both;20 reps Quad Sets: AROM;Left;10 reps Heel Slides: AROM;Left;10 reps   Assessment/Plan    PT Assessment Patient needs continued PT services  PT Problem List Decreased strength;Decreased balance;Decreased mobility;Decreased knowledge of use of DME       PT Treatment Interventions DME instruction;Gait training;Functional mobility training;Therapeutic activities;Therapeutic exercise;Balance training;Neuromuscular re-education;Patient/family education    PT Goals (Current goals can be found in the Care Plan section)  Acute Rehab PT Goals Patient Stated Goal: to go home  PT Goal Formulation: With patient Time For Goal Achievement: 02/01/18 Potential to Achieve Goals: Good    Frequency 7X/week   Barriers to discharge        Co-evaluation               AM-PAC PT "6 Clicks" Daily Activity  Outcome Measure Difficulty turning over in bed (including adjusting bedclothes, sheets and blankets)?: A Little Difficulty moving from lying on back to sitting on the side of the bed? : A Little Difficulty sitting down on and standing up from a chair with arms (e.g., wheelchair, bedside commode, etc,.)?: Unable Help needed moving to and from a bed to chair (including a wheelchair)?: A Little Help needed walking in hospital room?: A Little Help needed climbing 3-5 steps with a railing? : A Lot 6 Click Score: 15    End of Session Equipment Utilized During Treatment: Gait belt Activity Tolerance: Patient tolerated treatment well Patient left: with call bell/phone within reach;with family/visitor present;in chair Nurse Communication: Mobility status PT Visit Diagnosis: Other abnormalities of gait and mobility (R26.89);Unsteadiness on feet (R26.81)    Time: 2202-5427 PT Time Calculation (min) (ACUTE ONLY): 23 min   Charges:   PT Evaluation $PT Eval Low Complexity: 1 Low PT Treatments $Gait Training: 8-22 mins   PT G Codes:        Leighton Ruff, PT, DPT  Acute Rehabilitation Services  Pager: [phone]   Rudean Hitt 01/18/2018, 6:22 PM

## 2018-01-18 NOTE — Anesthesia Procedure Notes (Signed)
Spinal  Patient location during procedure: OR Staffing Anesthesiologist: Lyndle Herrlich, MD Spinal Block Patient position: sitting Prep: DuraPrep Patient monitoring: heart rate, blood pressure and continuous pulse ox Approach: right paramedian Location: L3-4 Injection technique: single-shot Needle Needle type: Sprotte  Needle gauge: 24 G Needle length: 9 cm Assessment Sensory level: T4 Additional Notes Spinal Dosage in OR  .75% Bupivicaine ml       1.9

## 2018-01-18 NOTE — Discharge Instructions (Signed)
°Dr. Aneesha Holloran °Joint Replacement Specialist °[facility] °[address] °[place], [place] [address] °[phone] ° ° °TOTAL HIP REPLACEMENT POSTOPERATIVE DIRECTIONS ° ° ° °Hip Rehabilitation, Guidelines Following Surgery  ° °WEIGHT BEARING °Weight bearing as tolerated with assist device (walker, cane, etc) as directed, use it as long as suggested by your surgeon or therapist, typically at least 4-6 weeks. ° °The results of a hip operation are greatly improved after range of motion and muscle strengthening exercises. Follow all safety measures which are given to protect your hip. If any of these exercises cause increased pain or swelling in your joint, decrease the amount until you are comfortable again. Then slowly increase the exercises. Call your caregiver if you have problems or questions.  ° °HOME CARE INSTRUCTIONS  °Most of the following instructions are designed to prevent the dislocation of your new hip.  °Remove items at home which could result in a fall. This includes throw rugs or furniture in walking pathways.  °Continue medications as instructed at time of discharge. °· You may have some home medications which will be placed on hold until you complete the course of blood thinner medication. °· You may start showering once you are discharged home. Do not remove your dressing. °Do not put on socks or shoes without following the instructions of your caregivers.   °Sit on chairs with arms. Use the chair arms to help push yourself up when arising.  °Arrange for the use of a toilet seat elevator so you are not sitting low.  °· Walk with walker as instructed.  °You may resume a sexual relationship in one month or when given the OK by your caregiver.  °Use walker as long as suggested by your caregivers.  °You may put full weight on your legs and walk as much as is comfortable. °Avoid periods of inactivity such as sitting longer than an hour when not asleep. This helps prevent  blood clots.  °You may return to work once you are cleared by your surgeon.  °Do not drive a car for 6 weeks or until released by your surgeon.  °Do not drive while taking narcotics.  °Wear elastic stockings for two weeks following surgery during the day but you may remove then at night.  °Make sure you keep all of your appointments after your operation with all of your doctors and caregivers. You should call the office at the above phone number and make an appointment for approximately two weeks after the date of your surgery. °Please pick up a stool softener and laxative for home use as long as you are requiring pain medications. °· ICE to the affected hip every three hours for 30 minutes at a time and then as needed for pain and swelling. Continue to use ice on the hip for pain and swelling from surgery. You may notice swelling that will progress down to the foot and ankle.  This is normal after surgery.  Elevate the leg when you are not up walking on it.   °It is important for you to complete the blood thinner medication as prescribed by your doctor. °· Continue to use the breathing machine which will help keep your temperature down.  It is common for your temperature to cycle up and down following surgery, especially at night when you are not up moving around and exerting yourself.  The breathing machine keeps your lungs expanded and your temperature down. ° °RANGE OF MOTION AND STRENGTHENING EXERCISES  °These exercises are   designed to help you keep full movement of your hip joint. Follow your caregiver's or physical therapist's instructions. Perform all exercises about fifteen times, three times per day or as directed. Exercise both hips, even if you have had only one joint replacement. These exercises can be done on a training (exercise) mat, on the floor, on a table or on a bed. Use whatever works the best and is most comfortable for you. Use music or television while you are exercising so that the exercises  are a pleasant break in your day. This will make your life better with the exercises acting as a break in routine you can look forward to.  °Lying on your back, slowly slide your foot toward your buttocks, raising your knee up off the floor. Then slowly slide your foot back down until your leg is straight again.  °Lying on your back spread your legs as far apart as you can without causing discomfort.  °Lying on your side, raise your upper leg and foot straight up from the floor as far as is comfortable. Slowly lower the leg and repeat.  °Lying on your back, tighten up the muscle in the front of your thigh (quadriceps muscles). You can do this by keeping your leg straight and trying to raise your heel off the floor. This helps strengthen the largest muscle supporting your knee.  °Lying on your back, tighten up the muscles of your buttocks both with the legs straight and with the knee bent at a comfortable angle while keeping your heel on the floor.  ° °SKILLED REHAB INSTRUCTIONS: °If the patient is transferred to a skilled rehab facility following release from the hospital, a list of the current medications will be sent to the facility for the patient to continue.  When discharged from the skilled rehab facility, please have the facility set up the patient's Home Health Physical Therapy prior to being released. Also, the skilled facility will be responsible for providing the patient with their medications at time of release from the facility to include their pain medication and their blood thinner medication. If the patient is still at the rehab facility at time of the two week follow up appointment, the skilled rehab facility will also need to assist the patient in arranging follow up appointment in our office and any transportation needs. ° °MAKE SURE YOU:  °Understand these instructions.  °Will watch your condition.  °Will get help right away if you are not doing well or get worse. ° °Pick up stool softner and  laxative for home use following surgery while on pain medications. °Do not remove your dressing. °The dressing is waterproof--it is OK to take showers. °Continue to use ice for pain and swelling after surgery. °Do not use any lotions or creams on the incision until instructed by your surgeon. °Total Hip Protocol. ° ° °

## 2018-01-18 NOTE — Transfer of Care (Signed)
Immediate Anesthesia Transfer of Care Note  Patient: Noah Ray  Procedure(s) Performed: LEFT TOTAL HIP ARTHROPLASTY ANTERIOR APPROACH (Left Hip)  Patient Location: PACU  Anesthesia Type:Spinal  Level of Consciousness: awake, oriented, drowsy, patient cooperative and responds to stimulation  Airway & Oxygen Therapy: Patient Spontanous Breathing and Patient connected to face mask oxygen  Post-op Assessment: Report given to RN and Post -op Vital signs reviewed and stable  Post vital signs: Reviewed and stable  Last Vitals:  Vitals Value Taken Time  BP 103/66 01/18/2018  9:50 AM  Temp    Pulse 49 01/18/2018  9:51 AM  Resp 13 01/18/2018  9:51 AM  SpO2 96 % 01/18/2018  9:51 AM  Vitals shown include unvalidated device data.  Last Pain:  Vitals:   01/18/18 0621  TempSrc:   PainSc: 0-No pain         Complications: No apparent anesthesia complications

## 2018-01-18 NOTE — Plan of Care (Signed)
  Problem: Education: Goal: Knowledge of General Education information will improve Outcome: Progressing   Problem: Health Behavior/Discharge Planning: Goal: Ability to manage health-related needs will improve Outcome: Progressing   Problem: Clinical Measurements: Goal: Ability to maintain clinical measurements within normal limits will improve Outcome: Progressing Goal: Will remain free from infection Outcome: Progressing Goal: Diagnostic test results will improve Outcome: Progressing Goal: Respiratory complications will improve Outcome: Progressing Goal: Cardiovascular complication will be avoided Outcome: Progressing   Problem: Activity: Goal: Risk for activity intolerance will decrease Outcome: Progressing   Problem: Nutrition: Goal: Adequate nutrition will be maintained Outcome: Progressing   Problem: Coping: Goal: Level of anxiety will decrease Outcome: Progressing   Problem: Elimination: Goal: Will not experience complications related to bowel motility Outcome: Progressing Goal: Will not experience complications related to urinary retention Outcome: Progressing   Problem: Pain Managment: Goal: General experience of comfort will improve Outcome: Progressing   Problem: Safety: Goal: Ability to remain free from injury will improve Outcome: Progressing   Problem: Skin Integrity: Goal: Risk for impaired skin integrity will decrease Outcome: Progressing   Problem: Education: Goal: Knowledge of the prescribed therapeutic regimen will improve Outcome: Progressing Goal: Understanding of discharge needs will improve Outcome: Progressing   Problem: Activity: Goal: Ability to avoid complications of mobility impairment will improve Outcome: Progressing Goal: Ability to tolerate increased activity will improve Outcome: Progressing   Problem: Clinical Measurements: Goal: Postoperative complications will be avoided or minimized Outcome: Progressing   Problem:  Pain Management: Goal: Pain level will decrease with appropriate interventions Outcome: Progressing   Problem: Skin Integrity: Goal: Signs of wound healing will improve Outcome: Progressing   

## 2018-01-18 NOTE — Op Note (Signed)
OPERATIVE REPORT  SURGEON: Rod Can, MD   ASSISTANT: Marylin Crosby, RNFA.  PREOPERATIVE DIAGNOSIS: Left hip arthritis.   POSTOPERATIVE DIAGNOSIS: Left hip arthritis.   PROCEDURE: Left total hip arthroplasty, anterior approach.   IMPLANTS: DePuy Tri Lock stem, size 9, std offset. DePuy Pinnacle Cup, size 60 mm. DePuy Altrx liner, size 36 by 60 mm, neutral. DePuy Biolox ceramic head ball, size 36 + 5 mm.  ANESTHESIA:  Spinal  ESTIMATED BLOOD LOSS:-275 mL    ANTIBIOTICS: 2 g Ancef.  DRAINS: None.  COMPLICATIONS: None.   CONDITION: PACU - hemodynamically stable.   BRIEF CLINICAL NOTE: Noah Ray is a 84 y.o. male with a long-standing history of Left hip arthritis. After failing conservative management, the patient was indicated for total hip arthroplasty. The risks, benefits, and alternatives to the procedure were explained, and the patient elected to proceed.  PROCEDURE IN DETAIL: Surgical site was marked by myself in the pre-op holding area. Once inside the operating room, spinal anesthesia was obtained, and a foley catheter was inserted. The patient was then positioned on the Hana table. All bony prominences were well padded. The hip was prepped and draped in the normal sterile surgical fashion. A time-out was called verifying side and site of surgery. The patient received IV antibiotics within 60 minutes of beginning the procedure.  The direct anterior approach to the hip was performed through the Hueter interval. Lateral femoral circumflex vessels were treated with the Auqumantys. The anterior capsule was exposed and an inverted T capsulotomy was made.The femoral neck cut was made to the level of the templated cut. A corkscrew was placed into the head and the head was removed. The femoral head was found to have eburnated bone. The head was passed to the back table and was measured.  Acetabular exposure was achieved, and the pulvinar and labrum were  excised. Sequential reaming of the acetabulum was then performed up to a size 59 mm reamer. A 60 mm cup was then opened and impacted into place at approximately 40 degrees of abduction and 20 degrees of anteversion. The final polyethylene liner was impacted into place and acetabular osteophytes were removed.   I then gained femoral exposure taking care to protect the abductors and greater trochanter. This was performed using standard external rotation, extension, and adduction. The capsule was peeled off the inner aspect of the greater trochanter, taking care to preserve the short external rotators. A cookie cutter was used to enter the femoral canal, and then the femoral canal finder was placed. Sequential broaching was performed up to a size 9. Calcar planer was used on the femoral neck remnant. I placed a std offset neck and a trial head ball. The hip was reduced. Leg lengths and offset were checked fluoroscopically. The hip was dislocated and trial components were removed. The final implants were placed, and the hip was reduced.  Ceramic head was chosen in order to reduce the risk of trunnionosis.  Fluoroscopy was used to confirm component position and leg lengths. At 90 degrees of external rotation and full extension, the hip was stable to an anterior directed force.  The wound was copiously irrigated with normal saline using pulse lavage. Marcaine solution was injected into the periarticular soft tissue. The wound was closed in layers using #1 Vicryl and V-Loc for the fascia, 2-0 Vicryl for the subcutaneous fat, 2-0 Monocryl for the deep dermal layer, 3-0 running Monocryl subcuticular stitch, and Dermabond for the skin. Once the glue was fully dried, an Aquacell  Ag dressing was applied. The patient was transported to the recovery room in stable condition. Sponge, needle, and instrument counts were correct at the end of the case x2. The patient tolerated the procedure well and there were  no known complications.

## 2018-01-18 NOTE — H&P (Signed)
TOTAL HIP ADMISSION H&P  Patient is admitted for left total hip arthroplasty.  Subjective:  Chief Complaint: left hip pain  HPI: Noah Ray, 84 y.o. male, has a history of pain and functional disability in the left hip(s) due to arthritis and patient has failed non-surgical conservative treatments for greater than 12 weeks to include NSAID's and/or analgesics, flexibility and strengthening excercises, supervised PT with diminished ADL's post treatment, use of assistive devices, weight reduction as appropriate and activity modification.  Onset of symptoms was gradual starting 3 years ago with gradually worsening course since that time.The patient noted no past surgery on the left hip(s).  Patient currently rates pain in the left hip at 10 out of 10 with activity. Patient has night pain, worsening of pain with activity and weight bearing, pain that interfers with activities of daily living and pain with passive range of motion. Patient has evidence of subchondral cysts, subchondral sclerosis, periarticular osteophytes and joint space narrowing by imaging studies. This condition presents safety issues increasing the risk of falls. There is no current active infection.  Patient Active Problem List   Diagnosis Date Noted  . Osteoarthritis of right hip 11/16/2017   Past Medical History:  Diagnosis Date  . Arthritis    "all over" (11/17/2017)  . Collapsed lung 1980s   "spontaneous"  . COPD (chronic obstructive pulmonary disease) ([place])   . Dyspnea    occasional with exertion  . Essential hypertension   . GERD (gastroesophageal reflux disease)   . Osteoarthritis of both knees   . Pneumonia    "twice" more than 5 yr s ago.    Past Surgical History:  Procedure Laterality Date  . COLONOSCOPY    . DG  BONE DENSITY ([place] [facility])    . JOINT REPLACEMENT    . TOTAL HIP ARTHROPLASTY Right 11/16/2017   Procedure: TOTAL HIP ARTHROPLASTY ANTERIOR APPROACH;  Surgeon: Rod Can, MD;  Location: [facility];   Service: Orthopedics;  Laterality: Right;    Current Facility-Administered Medications  Medication Dose Route Frequency Provider Last Rate Last Dose  . acetaminophen (OFIRMEV) IV 1,000 mg  1,000 mg Intravenous To OR Laurin Paulo, Aaron Edelman, MD      . ceFAZolin (ANCEF) 2-4 GM/100ML-% IVPB           . ceFAZolin (ANCEF) IVPB 2g/100 mL premix  2 g Intravenous On Call to [place], Aaron Edelman, MD      . chlorhexidine (HIBICLENS) 4 % liquid 4 application  60 mL Topical Once Iveliz Garay, Aaron Edelman, MD      . chlorhexidine (HIBICLENS) 4 % liquid 4 application  60 mL Topical Once Eliyah Bazzi, Aaron Edelman, MD      . povidone-iodine 10 % swab 2 application  2 application Topical Once Sherleen Pangborn, Aaron Edelman, MD      . tranexamic acid (CYKLOKAPRON) 1,000 mg in sodium chloride 0.9 % 100 mL IVPB  1,000 mg Intravenous To OR Rod Can, MD       Allergies  Allergen Reactions  . Shellfish Allergy Shortness Of Breath    Short of breath, hand cramping, ONLY crab legs    Social History   Tobacco Use  . Smoking status: Former Smoker    Types: Cigarettes  . Smokeless tobacco: Never Used  . Tobacco comment: "quit smoking in the 1970s"  Substance Use Topics  . Alcohol use: Yes    Comment: :a few drinks/month; nothing regular"    Family History  Problem Relation Age of Onset  . Stomach cancer Mother   . Prostate  cancer Father      Review of Systems  Constitutional: Negative.   HENT: Negative.   Eyes: Positive for blurred vision.  Respiratory: Positive for shortness of breath.   Cardiovascular: Positive for leg swelling.  Gastrointestinal: Negative.   Genitourinary: Negative.   Musculoskeletal: Positive for joint pain.  Skin: Positive for itching.  Neurological: Negative.   Endo/Heme/Allergies: Negative.   Psychiatric/Behavioral: Negative.     Objective:  Physical Exam  Vitals reviewed. Constitutional: He is oriented to person, place, and time. He appears well-developed and well-nourished.  HENT:  Head: Normocephalic  and atraumatic.  Eyes: Pupils are equal, round, and reactive to light. Conjunctivae and EOM are normal.  Neck: Normal range of motion. Neck supple.  Cardiovascular: Normal rate, regular rhythm and intact distal pulses.  Respiratory: Effort normal. No respiratory distress.  GI: Soft. He exhibits no distension.  Genitourinary:  Genitourinary Comments: deferred  Musculoskeletal:       Left hip: He exhibits decreased range of motion, decreased strength and bony tenderness.  Neurological: He is alert and oriented to person, place, and time. He has normal reflexes.  Skin: Skin is warm and dry.  Psychiatric: He has a normal mood and affect. His behavior is normal. Judgment and thought content normal.    Vital signs in last 24 hours: Temp:  [97.9 F (36.6 C)] 97.9 F (36.6 C) (04/08 0603) Pulse Rate:  [58] 58 (04/08 0603) Resp:  [20] 20 (04/08 0603) BP: (155)/(66) 155/66 (04/08 0603) SpO2:  [98 %] 98 % (04/08 0603)  Labs:   Estimated body mass index is 25.06 kg/m as calculated from the following:   Height as of 01/07/18: 6\' 3"  (1.905 m).   Weight as of 01/07/18: 90.9 kg (200 lb 8 oz).   Imaging Review Plain radiographs demonstrate severe degenerative joint disease of the left hip(s). The bone quality appears to be adequate for age and reported activity level.    Preoperative templating of the joint replacement has been completed, documented, and submitted to the Operating Room personnel in order to optimize intra-operative equipment management.   Anticipated LOS equal to or greater than 2 midnights due to - Age 66 and older with one or more of the following:  - Obesity  - Expected need for hospital services (PT, OT, Nursing) required for safe  discharge  - Anticipated need for postoperative skilled nursing care or inpatient rehab  - Active co-morbidities: None OR   - Unanticipated findings during/Post Surgery: None  - Patient is a high risk of re-admission due to:  None     Assessment/Plan:  End stage arthritis, left hip(s)  The patient history, physical examination, clinical judgement of the provider and imaging studies are consistent with end stage degenerative joint disease of the left hip(s) and total hip arthroplasty is deemed medically necessary. The treatment options including medical management, injection therapy, arthroscopy and arthroplasty were discussed at length. The risks and benefits of total hip arthroplasty were presented and reviewed. The risks due to aseptic loosening, infection, stiffness, dislocation/subluxation,  thromboembolic complications and other imponderables were discussed.  The patient acknowledged the explanation, agreed to proceed with the plan and consent was signed. Patient is being admitted for inpatient treatment for surgery, pain control, PT, OT, prophylactic antibiotics, VTE prophylaxis, progressive ambulation and ADL's and discharge planning.The patient is planning to be discharged home with HEP.

## 2018-01-19 ENCOUNTER — Encounter (HOSPITAL_COMMUNITY): Payer: Self-pay | Admitting: Orthopedic Surgery

## 2018-01-19 LAB — CBC
HCT: 33.3 % — ABNORMAL LOW (ref 39.0–52.0)
HEMOGLOBIN: 11.1 g/dL — AB (ref 13.0–17.0)
MCH: 29.1 pg (ref 26.0–34.0)
MCHC: 33.3 g/dL (ref 30.0–36.0)
MCV: 87.4 fL (ref 78.0–100.0)
PLATELETS: 287 10*3/uL (ref 150–400)
RBC: 3.81 MIL/uL — AB (ref 4.22–5.81)
RDW: 12.5 % (ref 11.5–15.5)
WBC: 15.3 10*3/uL — AB (ref 4.0–10.5)

## 2018-01-19 LAB — BASIC METABOLIC PANEL
ANION GAP: 8 (ref 5–15)
BUN: 22 mg/dL — ABNORMAL HIGH (ref 6–20)
CHLORIDE: 105 mmol/L (ref 101–111)
CO2: 21 mmol/L — ABNORMAL LOW (ref 22–32)
Calcium: 9.3 mg/dL (ref 8.9–10.3)
Creatinine, Ser: 1.5 mg/dL — ABNORMAL HIGH (ref 0.61–1.24)
GFR, EST AFRICAN AMERICAN: 48 mL/min — AB (ref >60)
GFR, EST NON AFRICAN AMERICAN: 41 mL/min — AB (ref >60)
Glucose, Bld: 173 mg/dL — ABNORMAL HIGH (ref 65–99)
POTASSIUM: 4.9 mmol/L (ref 3.5–5.1)
SODIUM: 134 mmol/L — AB (ref 135–145)

## 2018-01-19 MED ORDER — ASPIRIN 81 MG PO CHEW: 81.0000 mg | CHEWABLE_TABLET | Freq: Two times a day (BID) | ORAL | 1 refills | Status: AC

## 2018-01-19 MED ORDER — SENNA 8.6 MG PO TABS: 1.0000 | ORAL_TABLET | Freq: Two times a day (BID) | ORAL | 0 refills | Status: AC

## 2018-01-19 MED ORDER — HYDROCODONE-ACETAMINOPHEN 5-325 MG PO TABS: 1.0000 | ORAL_TABLET | ORAL | 0 refills | Status: AC | PRN

## 2018-01-19 MED ORDER — ONDANSETRON HCL 4 MG PO TABS: 4.0000 mg | ORAL_TABLET | Freq: Four times a day (QID) | ORAL | 0 refills | Status: AC | PRN

## 2018-01-19 NOTE — Progress Notes (Signed)
Physical Therapy Treatment Patient Details Name: Noah Ray MRN: [id number] DOB: 02-08-1932 Today's Date: 01/19/2018    History of Present Illness Pt is an 84 y/o male s/p elective L THA, direct anterior approach. PMH includes COPD and R THA.     PT Comments    Patient is progressing well toward PT goals. Pt tolerated increased gait distance and stair training well and without c/o increased pain. Overall pt required supervision/min guard assist. Continue to progress as tolerated.    Follow Up Recommendations  Follow surgeon's recommendation for DC plan and follow-up therapies;Supervision for mobility/OOB     Equipment Recommendations  None recommended by PT    Recommendations for Other Services       Precautions / Restrictions Precautions Precautions: None Restrictions Weight Bearing Restrictions: Yes LLE Weight Bearing: Weight bearing as tolerated    Mobility  Bed Mobility Overal bed mobility: Modified Independent Bed Mobility: Supine to Sit           General bed mobility comments: increased time and effort  Transfers Overall transfer level: Needs assistance Equipment used: Rolling walker (2 wheeled) Transfers: Sit to/from Stand Sit to Stand: Supervision         General transfer comment: supervision for safety; cues for safe use of AD  Ambulation/Gait Ambulation/Gait assistance: Supervision Ambulation Distance (Feet): 225 Feet Assistive device: Rolling walker (2 wheeled) Gait Pattern/deviations: Step-through pattern;Trunk flexed;Decreased stride length Gait velocity: Decreased    General Gait Details: cues for sequencing and posture   Stairs Stairs: Yes   Stair Management: Two rails;Step to pattern;Forwards Number of Stairs: (2 steps X2) General stair comments: cues for sequencing and technique  Wheelchair Mobility    Modified Rankin (Stroke Patients Only)       Balance Overall balance assessment: Needs assistance Sitting-balance support:  No upper extremity supported;Feet supported Sitting balance-Leahy Scale: Good     Standing balance support: During functional activity;Single extremity supported Standing balance-Leahy Scale: Poor                              Cognition Arousal/Alertness: Awake/alert Behavior During Therapy: WFL for tasks assessed/performed Overall Cognitive Status: Within Functional Limits for tasks assessed                                        Exercises      General Comments        Pertinent Vitals/Pain Pain Assessment: No/denies pain    Home Living                      Prior Function            PT Goals (current goals can now be found in the care plan section) Acute Rehab PT Goals Patient Stated Goal: to go home  PT Goal Formulation: With patient Time For Goal Achievement: 02/01/18 Potential to Achieve Goals: Good Progress towards PT goals: Progressing toward goals    Frequency    7X/week      PT Plan Current plan remains appropriate    Co-evaluation              AM-PAC PT "6 Clicks" Daily Activity  Outcome Measure  Difficulty turning over in bed (including adjusting bedclothes, sheets and blankets)?: A Little Difficulty moving from lying on back to sitting on the side of the bed? :  A Little Difficulty sitting down on and standing up from a chair with arms (e.g., wheelchair, bedside commode, etc,.)?: A Lot Help needed moving to and from a bed to chair (including a wheelchair)?: A Little Help needed walking in hospital room?: A Little Help needed climbing 3-5 steps with a railing? : A Little 6 Click Score: 17    End of Session Equipment Utilized During Treatment: Gait belt Activity Tolerance: Patient tolerated treatment well Patient left: with call bell/phone within reach;with family/visitor present;in chair Nurse Communication: Mobility status PT Visit Diagnosis: Other abnormalities of gait and mobility  (R26.89);Unsteadiness on feet (R26.81)     Time: 9381-8299 PT Time Calculation (min) (ACUTE ONLY): 13 min  Charges:  $Gait Training: 8-22 mins                    G Codes:       Earney Navy, PTA Pager: [phone]     Darliss Cheney 01/19/2018, 9:56 AM

## 2018-01-19 NOTE — Progress Notes (Signed)
    Subjective:  Patient reports pain as mild.  Denies N/V/CP/SOB. Wants to go home.  Objective:   VITALS:   Vitals:   01/18/18 1050 01/18/18 1131 01/18/18 2300 01/19/18 0546  BP: 116/74 124/64 (!) 109/50 137/71  Pulse: (!) 47 (!) 49 64 65  Resp: 19 18 18    Temp: 97.8 F (36.6 C)  98.2 F (36.8 C) 98.2 F (36.8 C)  TempSrc:   Axillary Oral  SpO2: 95% 97% 98% 95%    NAD ABD soft Sensation intact distally Intact pulses distally Dorsiflexion/Plantar flexion intact Incision: dressing C/D/I Compartment soft   Lab Results  Component Value Date   WBC 15.3 (H) 01/19/2018   HGB 11.1 (L) 01/19/2018   HCT 33.3 (L) 01/19/2018   MCV 87.4 01/19/2018   PLT 287 01/19/2018   BMET    Component Value Date/Time   NA 134 (L) 01/19/2018 0505   K 4.9 01/19/2018 0505   CL 105 01/19/2018 0505   CO2 21 (L) 01/19/2018 0505   GLUCOSE 173 (H) 01/19/2018 0505   BUN 22 (H) 01/19/2018 0505   CREATININE 1.50 (H) 01/19/2018 0505   CALCIUM 9.3 01/19/2018 0505   GFRNONAA 41 (L) 01/19/2018 0505   GFRAA 48 (L) 01/19/2018 0505     Assessment/Plan: 1 Day Post-Op   Principal Problem:   Osteoarthritis of left hip   WBAT with walker DVT ppx: ASA, SCDs, TEDS PO pain control PT/OT Dispo: D/C home with HEP   Noah Ray 01/19/2018, 1:15 PM   Rod Can, MD Cell [phone]

## 2018-01-19 NOTE — Care Management (Signed)
84 yr old gentleman s/p Left total hip arthroplasty. Patient will follow HEP at discharge. Has all DME.

## 2018-01-19 NOTE — Progress Notes (Signed)
[name] to be D/C'd Home per MD order.  Discussed prescriptions and follow up appointments with the patient. Prescriptions given to patient, medication list explained in detail. Pt verbalized understanding.  Allergies as of 01/19/2018      Reactions   Shellfish Allergy Shortness Of Breath   Short of breath, hand cramping, ONLY crab legs      Medication List    STOP taking these medications   aspirin 81 MG tablet Replaced by:  aspirin 81 MG chewable tablet     TAKE these medications   amLODipine 5 MG tablet Commonly known as:  NORVASC Take 5 mg by mouth daily.   aspirin 81 MG chewable tablet Chew 1 tablet (81 mg total) by mouth 2 (two) times daily. Replaces:  aspirin 81 MG tablet   benazepril 10 MG tablet Commonly known as:  LOTENSIN Take 10 mg by mouth daily.   docusate sodium 100 MG capsule Commonly known as:  COLACE Take 1 capsule (100 mg total) by mouth 2 (two) times daily. What changed:  when to take this   Fish Oil 1000 MG Caps Take 1,000 mg by mouth 2 (two) times daily.   HYDROcodone-acetaminophen 5-325 MG tablet Commonly known as:  NORCO/VICODIN Take 1-2 tablets by mouth every 4 (four) hours as needed for moderate pain (pain score 4-6). What changed:    when to take this  reasons to take this   MOVE FREE PO Take 1 tablet by mouth daily.   ondansetron 4 MG tablet Commonly known as:  ZOFRAN Take 1 tablet (4 mg total) by mouth every 6 (six) hours as needed for nausea.   polyethylene glycol packet Commonly known as:  MIRALAX / GLYCOLAX Take 17 g by mouth every other day.   senna 8.6 MG Tabs tablet Commonly known as:  SENOKOT Take 1 tablet (8.6 mg total) by mouth 2 (two) times daily.   sucralfate 1 g tablet Commonly known as:  CARAFATE Take 1 g by mouth 4 (four) times daily -  with meals and at bedtime.   VENTOLIN HFA 108 (90 Base) MCG/ACT inhaler Generic drug:  albuterol Inhale 1 puff into the lungs every 6 (six) hours as needed for wheezing or  shortness of breath.   Vitamin D3 1000 units Caps Take 1,000 Units by mouth daily.       Vitals:   01/18/18 2300 01/19/18 0546  BP: (!) 109/50 137/71  Pulse: 64 65  Resp: 18   Temp: 98.2 F (36.8 C) 98.2 F (36.8 C)  SpO2: 98% 95%    Skin clean, dry and intact without evidence of skin break down, no evidence of skin tears noted. Dressing clean, dry, and intact. TED hose placed BLE prior to discharge. IV catheter discontinued intact. Site without signs and symptoms of complications. Dressing and pressure applied. Pt denies pain at this time. No complaints noted.  An After Visit Summary and prescriptions were printed and given to the patient. Patient escorted via [place], and D/C home via private auto.  [place] RN

## 2018-01-19 NOTE — Discharge Summary (Signed)
Physician Discharge Summary  Patient ID: Noah Ray MRN: [id number] DOB/AGE: 10/31/1931 84 y.o.  Admit date: 01/18/2018 Discharge date: 01/19/2018  Admission Diagnoses:  Osteoarthritis of left hip  Discharge Diagnoses:  Principal Problem:   Osteoarthritis of left hip   Past Medical History:  Diagnosis Date  . Arthritis    "all over" (11/17/2017)  . Collapsed lung 1980s   "spontaneous"  . COPD (chronic obstructive pulmonary disease) ([place])   . Dyspnea    occasional with exertion  . Essential hypertension   . GERD (gastroesophageal reflux disease)   . Osteoarthritis of both knees   . Pneumonia    "twice" more than 5 yr s ago.    Surgeries: Procedure(s): LEFT TOTAL HIP ARTHROPLASTY ANTERIOR APPROACH on 01/18/2018   Consultants (if any):   Discharged Condition: Improved  Hospital Course: Noah Ray is an 84 y.o. male who was admitted 01/18/2018 with a diagnosis of Osteoarthritis of left hip and went to the operating room on 01/18/2018 and underwent the above named procedures.    He was given perioperative antibiotics:  Anti-infectives (From admission, onward)   Start     Dose/Rate Route Frequency Ordered Stop   01/18/18 1300  ceFAZolin (ANCEF) IVPB 2g/100 mL premix     2 g 200 mL/hr over 30 Minutes Intravenous Every 6 hours 01/18/18 0949 01/18/18 2349   01/18/18 0607  ceFAZolin (ANCEF) 2-4 GM/100ML-% IVPB    Note to Pharmacy:  Maryjean Ka   : cabinet override      01/18/18 0607 01/18/18 0730   01/18/18 0602  ceFAZolin (ANCEF) IVPB 2g/100 mL premix     2 g 200 mL/hr over 30 Minutes Intravenous On call to O.R. 01/18/18 0602 01/18/18 0730    .  He was given sequential compression devices, early ambulation, and ASA for DVT prophylaxis.  He benefited maximally from the hospital stay and there were no complications.    Recent vital signs:  Vitals:   01/18/18 2300 01/19/18 0546  BP: (!) 109/50 137/71  Pulse: 64 65  Resp: 18   Temp: 98.2 F (36.8 C) 98.2 F (36.8 C)   SpO2: 98% 95%    Recent laboratory studies:  Lab Results  Component Value Date   HGB 11.1 (L) 01/19/2018   HGB 14.1 01/07/2018   HGB 12.6 (L) 11/17/2017   Lab Results  Component Value Date   WBC 15.3 (H) 01/19/2018   PLT 287 01/19/2018   No results found for: INR Lab Results  Component Value Date   NA 134 (L) 01/19/2018   K 4.9 01/19/2018   CL 105 01/19/2018   CO2 21 (L) 01/19/2018   BUN 22 (H) 01/19/2018   CREATININE 1.50 (H) 01/19/2018   GLUCOSE 173 (H) 01/19/2018    Discharge Medications:   Allergies as of 01/19/2018      Reactions   Shellfish Allergy Shortness Of Breath   Short of breath, hand cramping, ONLY crab legs      Medication List    STOP taking these medications   aspirin 81 MG tablet Replaced by:  aspirin 81 MG chewable tablet     TAKE these medications   amLODipine 5 MG tablet Commonly known as:  NORVASC Take 5 mg by mouth daily.   aspirin 81 MG chewable tablet Chew 1 tablet (81 mg total) by mouth 2 (two) times daily. Replaces:  aspirin 81 MG tablet   benazepril 10 MG tablet Commonly known as:  LOTENSIN Take 10 mg by mouth daily.   docusate  sodium 100 MG capsule Commonly known as:  COLACE Take 1 capsule (100 mg total) by mouth 2 (two) times daily. What changed:  when to take this   Fish Oil 1000 MG Caps Take 1,000 mg by mouth 2 (two) times daily.   HYDROcodone-acetaminophen 5-325 MG tablet Commonly known as:  NORCO/VICODIN Take 1-2 tablets by mouth every 4 (four) hours as needed for moderate pain (pain score 4-6). What changed:    when to take this  reasons to take this   MOVE FREE PO Take 1 tablet by mouth daily.   ondansetron 4 MG tablet Commonly known as:  ZOFRAN Take 1 tablet (4 mg total) by mouth every 6 (six) hours as needed for nausea.   polyethylene glycol packet Commonly known as:  MIRALAX / GLYCOLAX Take 17 g by mouth every other day.   senna 8.6 MG Tabs tablet Commonly known as:  SENOKOT Take 1 tablet (8.6  mg total) by mouth 2 (two) times daily.   sucralfate 1 g tablet Commonly known as:  CARAFATE Take 1 g by mouth 4 (four) times daily -  with meals and at bedtime.   VENTOLIN HFA 108 (90 Base) MCG/ACT inhaler Generic drug:  albuterol Inhale 1 puff into the lungs every 6 (six) hours as needed for wheezing or shortness of breath.   Vitamin D3 1000 units Caps Take 1,000 Units by mouth daily.       Diagnostic Studies: Dg Pelvis Portable  Result Date: 01/18/2018 CLINICAL DATA:  Status post left hip replacement EXAM: PORTABLE PELVIS 1-2 VIEWS COMPARISON:  Intraoperative films from earlier in the same day. FINDINGS: Bilateral hip replacements are now seen. No acute bony or soft tissue abnormality is noted. IMPRESSION: No acute abnormality noted. Electronically Signed   By: Inez Catalina M.D.   On: 01/18/2018 10:36   Dg C-arm 1-60 Min  Result Date: 01/18/2018 CLINICAL DATA:  Intraoperative imaging for left hip replacement. EXAM: DG C-ARM 61-120 MIN; OPERATIVE LEFT HIP WITH PELVIS COMPARISON:  None. FINDINGS: Single fluoroscopic view of the low pelvis and left hip are provided. Images demonstrate a total hip arthroplasty in place. The device is located. No fracture or other acute abnormality. IMPRESSION: Intraoperative imaging for left hip replacement. Electronically Signed   By: Inge Rise M.D.   On: 01/18/2018 09:16   Dg C-arm 1-60 Min  Result Date: 01/18/2018 CLINICAL DATA:  Intraoperative imaging for left hip replacement. EXAM: DG C-ARM 61-120 MIN; OPERATIVE LEFT HIP WITH PELVIS COMPARISON:  None. FINDINGS: Single fluoroscopic view of the low pelvis and left hip are provided. Images demonstrate a total hip arthroplasty in place. The device is located. No fracture or other acute abnormality. IMPRESSION: Intraoperative imaging for left hip replacement. Electronically Signed   By: Inge Rise M.D.   On: 01/18/2018 09:16   Dg Hip Operative Unilat W Or W/o Pelvis Left  Result Date:  01/18/2018 CLINICAL DATA:  Intraoperative imaging for left hip replacement. EXAM: DG C-ARM 61-120 MIN; OPERATIVE LEFT HIP WITH PELVIS COMPARISON:  None. FINDINGS: Single fluoroscopic view of the low pelvis and left hip are provided. Images demonstrate a total hip arthroplasty in place. The device is located. No fracture or other acute abnormality. IMPRESSION: Intraoperative imaging for left hip replacement. Electronically Signed   By: Inge Rise M.D.   On: 01/18/2018 09:16    Disposition:    Discharge Instructions    Call MD / Call 911   Complete by:  As directed    If you  experience chest pain or shortness of breath, CALL 911 and be transported to the hospital emergency room.  If you develope a fever above 101 F, pus (white drainage) or increased drainage or redness at the wound, or calf pain, call your surgeon's office.   Constipation Prevention   Complete by:  As directed    Drink plenty of fluids.  Prune juice may be helpful.  You may use a stool softener, such as Colace (over the counter) 100 mg twice a day.  Use MiraLax (over the counter) for constipation as needed.   Diet - low sodium heart healthy   Complete by:  As directed    Driving restrictions   Complete by:  As directed    No driving for 6 weeks   Increase activity slowly as tolerated   Complete by:  As directed    Lifting restrictions   Complete by:  As directed    No lifting for 6 weeks   TED hose   Complete by:  As directed    Use stockings (TED hose) for 2 weeks on both leg(s).  You may remove them at night for sleeping.      Follow-up Information    Mattson Dayal, Aaron Edelman, MD. Schedule an appointment as soon as possible for a visit in 2 weeks.   Specialty:  Orthopedic Surgery Why:  For wound re-check Contact information: [address] [place] [address] [phone]            Signed: Hilton Cork Doyt Castellana 01/21/2018, 7:23 AM

## 2018-01-20 NOTE — Anesthesia Postprocedure Evaluation (Signed)
Anesthesia Post Note  Patient: Noah Ray  Procedure(s) Performed: LEFT TOTAL HIP ARTHROPLASTY ANTERIOR APPROACH (Left Hip)     Patient location during evaluation: PACU Anesthesia Type: Spinal Level of consciousness: awake Pain management: satisfactory to patient Vital Signs Assessment: post-procedure vital signs reviewed and stable Respiratory status: spontaneous breathing Cardiovascular status: blood pressure returned to baseline Postop Assessment: no headache and spinal receding Anesthetic complications: no    Last Vitals:  Vitals:   01/18/18 2300 01/19/18 0546  BP: (!) 109/50 137/71  Pulse: 64 65  Resp: 18   Temp: 36.8 C 36.8 C  SpO2: 98% 95%    Last Pain:  Vitals:   01/19/18 1038  TempSrc:   PainSc: 0-No pain                 Rakeem Colley EDWARD

## 2018-02-02 DIAGNOSIS — Z96642 Presence of left artificial hip joint: Secondary | ICD-10-CM | POA: Diagnosis not present

## 2018-02-02 DIAGNOSIS — Z471 Aftercare following joint replacement surgery: Secondary | ICD-10-CM | POA: Diagnosis not present

## 2018-02-03 DIAGNOSIS — N400 Enlarged prostate without lower urinary tract symptoms: Secondary | ICD-10-CM | POA: Diagnosis not present

## 2018-02-03 DIAGNOSIS — N182 Chronic kidney disease, stage 2 (mild): Secondary | ICD-10-CM | POA: Diagnosis not present

## 2018-02-03 DIAGNOSIS — Z0000 Encounter for general adult medical examination without abnormal findings: Secondary | ICD-10-CM | POA: Diagnosis not present

## 2018-02-03 DIAGNOSIS — Z6824 Body mass index (BMI) 24.0-24.9, adult: Secondary | ICD-10-CM | POA: Diagnosis not present

## 2018-02-03 DIAGNOSIS — J441 Chronic obstructive pulmonary disease with (acute) exacerbation: Secondary | ICD-10-CM | POA: Diagnosis not present

## 2018-02-03 DIAGNOSIS — I10 Essential (primary) hypertension: Secondary | ICD-10-CM | POA: Diagnosis not present

## 2018-02-03 DIAGNOSIS — M14862 Arthropathies in other specified diseases classified elsewhere, left knee: Secondary | ICD-10-CM | POA: Diagnosis not present

## 2018-02-03 DIAGNOSIS — G5602 Carpal tunnel syndrome, left upper limb: Secondary | ICD-10-CM | POA: Diagnosis not present

## 2018-02-05 DIAGNOSIS — R69 Illness, unspecified: Secondary | ICD-10-CM | POA: Diagnosis not present

## 2018-03-03 DIAGNOSIS — M1612 Unilateral primary osteoarthritis, left hip: Secondary | ICD-10-CM | POA: Diagnosis not present

## 2018-03-03 DIAGNOSIS — M1712 Unilateral primary osteoarthritis, left knee: Secondary | ICD-10-CM | POA: Diagnosis not present

## 2018-03-05 DIAGNOSIS — G5602 Carpal tunnel syndrome, left upper limb: Secondary | ICD-10-CM | POA: Diagnosis not present

## 2018-04-06 DIAGNOSIS — R69 Illness, unspecified: Secondary | ICD-10-CM | POA: Diagnosis not present

## 2018-05-26 DIAGNOSIS — Z6823 Body mass index (BMI) 23.0-23.9, adult: Secondary | ICD-10-CM | POA: Diagnosis not present

## 2018-05-26 DIAGNOSIS — M14862 Arthropathies in other specified diseases classified elsewhere, left knee: Secondary | ICD-10-CM | POA: Diagnosis not present

## 2018-05-26 DIAGNOSIS — I10 Essential (primary) hypertension: Secondary | ICD-10-CM | POA: Diagnosis not present

## 2018-05-26 DIAGNOSIS — G5602 Carpal tunnel syndrome, left upper limb: Secondary | ICD-10-CM | POA: Diagnosis not present

## 2018-05-26 DIAGNOSIS — N400 Enlarged prostate without lower urinary tract symptoms: Secondary | ICD-10-CM | POA: Diagnosis not present

## 2018-05-26 DIAGNOSIS — J441 Chronic obstructive pulmonary disease with (acute) exacerbation: Secondary | ICD-10-CM | POA: Diagnosis not present

## 2018-05-26 DIAGNOSIS — N182 Chronic kidney disease, stage 2 (mild): Secondary | ICD-10-CM | POA: Diagnosis not present

## 2018-05-28 DIAGNOSIS — G5602 Carpal tunnel syndrome, left upper limb: Secondary | ICD-10-CM | POA: Diagnosis not present

## 2018-05-28 DIAGNOSIS — Z6823 Body mass index (BMI) 23.0-23.9, adult: Secondary | ICD-10-CM | POA: Diagnosis not present

## 2018-05-28 DIAGNOSIS — N400 Enlarged prostate without lower urinary tract symptoms: Secondary | ICD-10-CM | POA: Diagnosis not present

## 2018-05-28 DIAGNOSIS — J441 Chronic obstructive pulmonary disease with (acute) exacerbation: Secondary | ICD-10-CM | POA: Diagnosis not present

## 2018-05-28 DIAGNOSIS — R0602 Shortness of breath: Secondary | ICD-10-CM | POA: Diagnosis not present

## 2018-05-28 DIAGNOSIS — I10 Essential (primary) hypertension: Secondary | ICD-10-CM | POA: Diagnosis not present

## 2018-05-28 DIAGNOSIS — M14862 Arthropathies in other specified diseases classified elsewhere, left knee: Secondary | ICD-10-CM | POA: Diagnosis not present

## 2018-05-28 DIAGNOSIS — N182 Chronic kidney disease, stage 2 (mild): Secondary | ICD-10-CM | POA: Diagnosis not present

## 2018-06-15 DIAGNOSIS — R69 Illness, unspecified: Secondary | ICD-10-CM | POA: Diagnosis not present

## 2018-06-30 DIAGNOSIS — H40013 Open angle with borderline findings, low risk, bilateral: Secondary | ICD-10-CM | POA: Diagnosis not present

## 2018-06-30 DIAGNOSIS — H2512 Age-related nuclear cataract, left eye: Secondary | ICD-10-CM | POA: Diagnosis not present

## 2018-06-30 DIAGNOSIS — H11153 Pinguecula, bilateral: Secondary | ICD-10-CM | POA: Diagnosis not present

## 2018-06-30 DIAGNOSIS — H02831 Dermatochalasis of right upper eyelid: Secondary | ICD-10-CM | POA: Diagnosis not present

## 2018-06-30 DIAGNOSIS — H0288B Meibomian gland dysfunction left eye, upper and lower eyelids: Secondary | ICD-10-CM | POA: Diagnosis not present

## 2018-06-30 DIAGNOSIS — H02834 Dermatochalasis of left upper eyelid: Secondary | ICD-10-CM | POA: Diagnosis not present

## 2018-06-30 DIAGNOSIS — H2511 Age-related nuclear cataract, right eye: Secondary | ICD-10-CM | POA: Diagnosis not present

## 2018-06-30 DIAGNOSIS — H0288A Meibomian gland dysfunction right eye, upper and lower eyelids: Secondary | ICD-10-CM | POA: Diagnosis not present

## 2018-07-20 DIAGNOSIS — H2512 Age-related nuclear cataract, left eye: Secondary | ICD-10-CM | POA: Diagnosis not present

## 2018-07-20 DIAGNOSIS — H25012 Cortical age-related cataract, left eye: Secondary | ICD-10-CM | POA: Diagnosis not present

## 2018-07-21 DIAGNOSIS — Z961 Presence of intraocular lens: Secondary | ICD-10-CM | POA: Diagnosis not present

## 2018-07-21 DIAGNOSIS — H0288A Meibomian gland dysfunction right eye, upper and lower eyelids: Secondary | ICD-10-CM | POA: Diagnosis not present

## 2018-07-21 DIAGNOSIS — H0288B Meibomian gland dysfunction left eye, upper and lower eyelids: Secondary | ICD-10-CM | POA: Diagnosis not present

## 2018-07-21 DIAGNOSIS — H40013 Open angle with borderline findings, low risk, bilateral: Secondary | ICD-10-CM | POA: Diagnosis not present

## 2018-07-21 DIAGNOSIS — H2511 Age-related nuclear cataract, right eye: Secondary | ICD-10-CM | POA: Diagnosis not present

## 2018-07-21 DIAGNOSIS — H11153 Pinguecula, bilateral: Secondary | ICD-10-CM | POA: Diagnosis not present

## 2018-07-21 DIAGNOSIS — H02831 Dermatochalasis of right upper eyelid: Secondary | ICD-10-CM | POA: Diagnosis not present

## 2018-07-21 DIAGNOSIS — H02834 Dermatochalasis of left upper eyelid: Secondary | ICD-10-CM | POA: Diagnosis not present

## 2018-07-26 DIAGNOSIS — K449 Diaphragmatic hernia without obstruction or gangrene: Secondary | ICD-10-CM | POA: Diagnosis not present

## 2018-07-26 DIAGNOSIS — J9601 Acute respiratory failure with hypoxia: Secondary | ICD-10-CM | POA: Diagnosis not present

## 2018-07-26 DIAGNOSIS — R571 Hypovolemic shock: Secondary | ICD-10-CM | POA: Diagnosis not present

## 2018-07-26 DIAGNOSIS — R579 Shock, unspecified: Secondary | ICD-10-CM | POA: Diagnosis not present

## 2018-07-26 DIAGNOSIS — D649 Anemia, unspecified: Secondary | ICD-10-CM | POA: Diagnosis not present

## 2018-07-26 DIAGNOSIS — R1112 Projectile vomiting: Secondary | ICD-10-CM | POA: Diagnosis not present

## 2018-07-26 DIAGNOSIS — S42035A Nondisplaced fracture of lateral end of left clavicle, initial encounter for closed fracture: Secondary | ICD-10-CM | POA: Diagnosis not present

## 2018-07-26 DIAGNOSIS — S270XXA Traumatic pneumothorax, initial encounter: Secondary | ICD-10-CM | POA: Diagnosis not present

## 2018-07-26 DIAGNOSIS — S32000A Wedge compression fracture of unspecified lumbar vertebra, initial encounter for closed fracture: Secondary | ICD-10-CM | POA: Diagnosis not present

## 2018-07-26 DIAGNOSIS — R509 Fever, unspecified: Secondary | ICD-10-CM | POA: Diagnosis not present

## 2018-07-26 DIAGNOSIS — S329XXA Fracture of unspecified parts of lumbosacral spine and pelvis, initial encounter for closed fracture: Secondary | ICD-10-CM | POA: Diagnosis not present

## 2018-07-26 DIAGNOSIS — I10 Essential (primary) hypertension: Secondary | ICD-10-CM | POA: Diagnosis not present

## 2018-07-26 DIAGNOSIS — R9431 Abnormal electrocardiogram [ECG] [EKG]: Secondary | ICD-10-CM | POA: Diagnosis not present

## 2018-07-26 DIAGNOSIS — J9600 Acute respiratory failure, unspecified whether with hypoxia or hypercapnia: Secondary | ICD-10-CM | POA: Diagnosis not present

## 2018-07-26 DIAGNOSIS — S42115A Nondisplaced fracture of body of scapula, left shoulder, initial encounter for closed fracture: Secondary | ICD-10-CM | POA: Diagnosis not present

## 2018-07-26 DIAGNOSIS — R4182 Altered mental status, unspecified: Secondary | ICD-10-CM | POA: Diagnosis not present

## 2018-07-26 DIAGNOSIS — I519 Heart disease, unspecified: Secondary | ICD-10-CM | POA: Diagnosis not present

## 2018-07-26 DIAGNOSIS — Z1635 Resistance to multiple antimicrobial drugs: Secondary | ICD-10-CM | POA: Diagnosis not present

## 2018-07-26 DIAGNOSIS — S32402A Unspecified fracture of left acetabulum, initial encounter for closed fracture: Secondary | ICD-10-CM | POA: Diagnosis not present

## 2018-07-26 DIAGNOSIS — S32028A Other fracture of second lumbar vertebra, initial encounter for closed fracture: Secondary | ICD-10-CM | POA: Diagnosis not present

## 2018-07-26 DIAGNOSIS — S32810A Multiple fractures of pelvis with stable disruption of pelvic ring, initial encounter for closed fracture: Secondary | ICD-10-CM | POA: Diagnosis not present

## 2018-07-26 DIAGNOSIS — N170 Acute kidney failure with tubular necrosis: Secondary | ICD-10-CM | POA: Diagnosis not present

## 2018-07-26 DIAGNOSIS — Z9889 Other specified postprocedural states: Secondary | ICD-10-CM | POA: Diagnosis not present

## 2018-07-26 DIAGNOSIS — S32811D Multiple fractures of pelvis with unstable disruption of pelvic ring, subsequent encounter for fracture with routine healing: Secondary | ICD-10-CM | POA: Diagnosis not present

## 2018-07-26 DIAGNOSIS — S42025A Nondisplaced fracture of shaft of left clavicle, initial encounter for closed fracture: Secondary | ICD-10-CM | POA: Diagnosis not present

## 2018-07-26 DIAGNOSIS — R739 Hyperglycemia, unspecified: Secondary | ICD-10-CM | POA: Diagnosis not present

## 2018-07-26 DIAGNOSIS — J984 Other disorders of lung: Secondary | ICD-10-CM | POA: Diagnosis not present

## 2018-07-26 DIAGNOSIS — R0603 Acute respiratory distress: Secondary | ICD-10-CM | POA: Diagnosis not present

## 2018-07-26 DIAGNOSIS — N179 Acute kidney failure, unspecified: Secondary | ICD-10-CM | POA: Diagnosis not present

## 2018-07-26 DIAGNOSIS — R569 Unspecified convulsions: Secondary | ICD-10-CM | POA: Diagnosis not present

## 2018-07-26 DIAGNOSIS — S32432A Displaced fracture of anterior column [iliopubic] of left acetabulum, initial encounter for closed fracture: Secondary | ICD-10-CM | POA: Diagnosis not present

## 2018-07-26 DIAGNOSIS — Z452 Encounter for adjustment and management of vascular access device: Secondary | ICD-10-CM | POA: Diagnosis not present

## 2018-07-26 DIAGNOSIS — K922 Gastrointestinal hemorrhage, unspecified: Secondary | ICD-10-CM | POA: Diagnosis not present

## 2018-07-26 DIAGNOSIS — T8241XA Breakdown (mechanical) of vascular dialysis catheter, initial encounter: Secondary | ICD-10-CM | POA: Diagnosis not present

## 2018-07-26 DIAGNOSIS — S3219XA Other fracture of sacrum, initial encounter for closed fracture: Secondary | ICD-10-CM | POA: Diagnosis not present

## 2018-07-26 DIAGNOSIS — T794XXA Traumatic shock, initial encounter: Secondary | ICD-10-CM | POA: Diagnosis not present

## 2018-07-26 DIAGNOSIS — R001 Bradycardia, unspecified: Secondary | ICD-10-CM | POA: Diagnosis not present

## 2018-07-26 DIAGNOSIS — I480 Paroxysmal atrial fibrillation: Secondary | ICD-10-CM | POA: Diagnosis not present

## 2018-07-26 DIAGNOSIS — Z438 Encounter for attention to other artificial openings: Secondary | ICD-10-CM | POA: Diagnosis not present

## 2018-07-26 DIAGNOSIS — J811 Chronic pulmonary edema: Secondary | ICD-10-CM | POA: Diagnosis not present

## 2018-07-26 DIAGNOSIS — S3219XD Other fracture of sacrum, subsequent encounter for fracture with routine healing: Secondary | ICD-10-CM | POA: Diagnosis not present

## 2018-07-26 DIAGNOSIS — Z4682 Encounter for fitting and adjustment of non-vascular catheter: Secondary | ICD-10-CM | POA: Diagnosis not present

## 2018-07-26 DIAGNOSIS — S22088A Other fracture of T11-T12 vertebra, initial encounter for closed fracture: Secondary | ICD-10-CM | POA: Diagnosis not present

## 2018-07-26 DIAGNOSIS — S32512A Fracture of superior rim of left pubis, initial encounter for closed fracture: Secondary | ICD-10-CM | POA: Diagnosis not present

## 2018-07-26 DIAGNOSIS — I6389 Other cerebral infarction: Secondary | ICD-10-CM | POA: Diagnosis not present

## 2018-07-26 DIAGNOSIS — S2242XA Multiple fractures of ribs, left side, initial encounter for closed fracture: Secondary | ICD-10-CM | POA: Diagnosis not present

## 2018-07-26 DIAGNOSIS — S32058A Other fracture of fifth lumbar vertebra, initial encounter for closed fracture: Secondary | ICD-10-CM | POA: Diagnosis not present

## 2018-07-26 DIAGNOSIS — J449 Chronic obstructive pulmonary disease, unspecified: Secondary | ICD-10-CM | POA: Diagnosis not present

## 2018-07-26 DIAGNOSIS — Y92410 Unspecified street and highway as the place of occurrence of the external cause: Secondary | ICD-10-CM | POA: Diagnosis not present

## 2018-07-26 DIAGNOSIS — J181 Lobar pneumonia, unspecified organism: Secondary | ICD-10-CM | POA: Diagnosis not present

## 2018-07-26 DIAGNOSIS — M25551 Pain in right hip: Secondary | ICD-10-CM | POA: Diagnosis not present

## 2018-07-26 DIAGNOSIS — R000 Tachycardia, unspecified: Secondary | ICD-10-CM | POA: Diagnosis not present

## 2018-07-26 DIAGNOSIS — R578 Other shock: Secondary | ICD-10-CM | POA: Diagnosis not present

## 2018-07-26 DIAGNOSIS — Z4803 Encounter for change or removal of drains: Secondary | ICD-10-CM | POA: Diagnosis not present

## 2018-07-26 DIAGNOSIS — J939 Pneumothorax, unspecified: Secondary | ICD-10-CM | POA: Diagnosis not present

## 2018-07-26 DIAGNOSIS — R0489 Hemorrhage from other sites in respiratory passages: Secondary | ICD-10-CM | POA: Diagnosis not present

## 2018-07-26 DIAGNOSIS — R7981 Abnormal blood-gas level: Secondary | ICD-10-CM | POA: Diagnosis not present

## 2018-07-26 DIAGNOSIS — S332XXD Dislocation of sacroiliac and sacrococcygeal joint, subsequent encounter: Secondary | ICD-10-CM | POA: Diagnosis not present

## 2018-07-26 DIAGNOSIS — N186 End stage renal disease: Secondary | ICD-10-CM | POA: Diagnosis not present

## 2018-07-26 DIAGNOSIS — J188 Other pneumonia, unspecified organism: Secondary | ICD-10-CM | POA: Diagnosis not present

## 2018-07-26 DIAGNOSIS — S32599A Other specified fracture of unspecified pubis, initial encounter for closed fracture: Secondary | ICD-10-CM | POA: Diagnosis not present

## 2018-07-26 DIAGNOSIS — J90 Pleural effusion, not elsewhere classified: Secondary | ICD-10-CM | POA: Diagnosis not present

## 2018-07-26 DIAGNOSIS — E872 Acidosis: Secondary | ICD-10-CM | POA: Diagnosis not present

## 2018-07-26 DIAGNOSIS — S32592A Other specified fracture of left pubis, initial encounter for closed fracture: Secondary | ICD-10-CM | POA: Diagnosis not present

## 2018-07-26 DIAGNOSIS — Z041 Encounter for examination and observation following transport accident: Secondary | ICD-10-CM | POA: Diagnosis not present

## 2018-07-26 DIAGNOSIS — Z7901 Long term (current) use of anticoagulants: Secondary | ICD-10-CM | POA: Diagnosis not present

## 2018-07-26 DIAGNOSIS — A4150 Gram-negative sepsis, unspecified: Secondary | ICD-10-CM | POA: Diagnosis not present

## 2018-07-26 DIAGNOSIS — A419 Sepsis, unspecified organism: Secondary | ICD-10-CM | POA: Diagnosis not present

## 2018-07-26 DIAGNOSIS — N3000 Acute cystitis without hematuria: Secondary | ICD-10-CM | POA: Diagnosis not present

## 2018-07-26 DIAGNOSIS — R570 Cardiogenic shock: Secondary | ICD-10-CM | POA: Diagnosis not present

## 2018-07-26 DIAGNOSIS — J189 Pneumonia, unspecified organism: Secondary | ICD-10-CM | POA: Diagnosis not present

## 2018-07-26 DIAGNOSIS — J439 Emphysema, unspecified: Secondary | ICD-10-CM | POA: Diagnosis not present

## 2018-07-26 DIAGNOSIS — Z515 Encounter for palliative care: Secondary | ICD-10-CM | POA: Diagnosis not present

## 2018-07-26 DIAGNOSIS — J9602 Acute respiratory failure with hypercapnia: Secondary | ICD-10-CM | POA: Diagnosis not present

## 2018-07-26 DIAGNOSIS — Z930 Tracheostomy status: Secondary | ICD-10-CM | POA: Diagnosis not present

## 2018-07-26 DIAGNOSIS — J810 Acute pulmonary edema: Secondary | ICD-10-CM | POA: Diagnosis not present

## 2018-07-26 DIAGNOSIS — R0902 Hypoxemia: Secondary | ICD-10-CM | POA: Diagnosis not present

## 2018-07-26 DIAGNOSIS — Z938 Other artificial opening status: Secondary | ICD-10-CM | POA: Diagnosis not present

## 2018-07-26 DIAGNOSIS — Z23 Encounter for immunization: Secondary | ICD-10-CM | POA: Diagnosis not present

## 2018-07-26 DIAGNOSIS — J9690 Respiratory failure, unspecified, unspecified whether with hypoxia or hypercapnia: Secondary | ICD-10-CM | POA: Diagnosis not present

## 2018-07-26 DIAGNOSIS — Z01818 Encounter for other preprocedural examination: Secondary | ICD-10-CM | POA: Diagnosis not present

## 2018-07-26 DIAGNOSIS — S22009A Unspecified fracture of unspecified thoracic vertebra, initial encounter for closed fracture: Secondary | ICD-10-CM | POA: Diagnosis not present

## 2018-07-26 DIAGNOSIS — S32048A Other fracture of fourth lumbar vertebra, initial encounter for closed fracture: Secondary | ICD-10-CM | POA: Diagnosis not present

## 2018-07-26 DIAGNOSIS — J13 Pneumonia due to Streptococcus pneumoniae: Secondary | ICD-10-CM | POA: Diagnosis not present

## 2018-07-26 DIAGNOSIS — Z9911 Dependence on respirator [ventilator] status: Secondary | ICD-10-CM | POA: Diagnosis not present

## 2018-07-26 DIAGNOSIS — I499 Cardiac arrhythmia, unspecified: Secondary | ICD-10-CM | POA: Diagnosis not present

## 2018-07-26 DIAGNOSIS — Z931 Gastrostomy status: Secondary | ICD-10-CM | POA: Diagnosis not present

## 2018-07-26 DIAGNOSIS — R410 Disorientation, unspecified: Secondary | ICD-10-CM | POA: Diagnosis not present

## 2018-07-26 DIAGNOSIS — I358 Other nonrheumatic aortic valve disorders: Secondary | ICD-10-CM | POA: Diagnosis not present

## 2018-07-26 DIAGNOSIS — D631 Anemia in chronic kidney disease: Secondary | ICD-10-CM | POA: Diagnosis not present

## 2018-07-26 DIAGNOSIS — S32391A Other fracture of right ilium, initial encounter for closed fracture: Secondary | ICD-10-CM | POA: Diagnosis not present

## 2018-07-26 DIAGNOSIS — K920 Hematemesis: Secondary | ICD-10-CM | POA: Diagnosis not present

## 2018-07-26 DIAGNOSIS — E8770 Fluid overload, unspecified: Secondary | ICD-10-CM | POA: Diagnosis not present

## 2018-07-26 DIAGNOSIS — I959 Hypotension, unspecified: Secondary | ICD-10-CM | POA: Diagnosis not present

## 2018-07-26 DIAGNOSIS — K661 Hemoperitoneum: Secondary | ICD-10-CM | POA: Diagnosis not present

## 2018-07-26 DIAGNOSIS — S42002A Fracture of unspecified part of left clavicle, initial encounter for closed fracture: Secondary | ICD-10-CM | POA: Diagnosis not present

## 2018-07-26 DIAGNOSIS — S32591D Other specified fracture of right pubis, subsequent encounter for fracture with routine healing: Secondary | ICD-10-CM | POA: Diagnosis not present

## 2018-07-26 DIAGNOSIS — S42102A Fracture of unspecified part of scapula, left shoulder, initial encounter for closed fracture: Secondary | ICD-10-CM | POA: Diagnosis not present

## 2018-07-26 DIAGNOSIS — S22000A Wedge compression fracture of unspecified thoracic vertebra, initial encounter for closed fracture: Secondary | ICD-10-CM | POA: Diagnosis not present

## 2018-07-26 DIAGNOSIS — K921 Melena: Secondary | ICD-10-CM | POA: Diagnosis not present

## 2018-07-26 DIAGNOSIS — J151 Pneumonia due to Pseudomonas: Secondary | ICD-10-CM | POA: Diagnosis not present

## 2018-07-26 DIAGNOSIS — D7881 Other intraoperative complications of the spleen: Secondary | ICD-10-CM | POA: Diagnosis not present

## 2018-07-26 DIAGNOSIS — R932 Abnormal findings on diagnostic imaging of liver and biliary tract: Secondary | ICD-10-CM | POA: Diagnosis not present

## 2018-07-26 DIAGNOSIS — I639 Cerebral infarction, unspecified: Secondary | ICD-10-CM | POA: Diagnosis not present

## 2018-07-26 DIAGNOSIS — I872 Venous insufficiency (chronic) (peripheral): Secondary | ICD-10-CM | POA: Diagnosis not present

## 2018-07-26 DIAGNOSIS — S32009A Unspecified fracture of unspecified lumbar vertebra, initial encounter for closed fracture: Secondary | ICD-10-CM | POA: Diagnosis not present

## 2018-07-26 DIAGNOSIS — S32591A Other specified fracture of right pubis, initial encounter for closed fracture: Secondary | ICD-10-CM | POA: Diagnosis not present

## 2018-07-26 DIAGNOSIS — J9811 Atelectasis: Secondary | ICD-10-CM | POA: Diagnosis not present

## 2018-07-26 DIAGNOSIS — S32018A Other fracture of first lumbar vertebra, initial encounter for closed fracture: Secondary | ICD-10-CM | POA: Diagnosis not present

## 2018-07-26 DIAGNOSIS — Z9081 Acquired absence of spleen: Secondary | ICD-10-CM | POA: Diagnosis not present

## 2018-07-26 DIAGNOSIS — D62 Acute posthemorrhagic anemia: Secondary | ICD-10-CM | POA: Diagnosis not present

## 2018-07-26 DIAGNOSIS — R609 Edema, unspecified: Secondary | ICD-10-CM | POA: Diagnosis not present

## 2018-07-26 DIAGNOSIS — R6521 Severe sepsis with septic shock: Secondary | ICD-10-CM | POA: Diagnosis not present

## 2018-07-26 DIAGNOSIS — E43 Unspecified severe protein-calorie malnutrition: Secondary | ICD-10-CM | POA: Diagnosis not present

## 2018-07-26 DIAGNOSIS — I21A1 Myocardial infarction type 2: Secondary | ICD-10-CM | POA: Diagnosis not present

## 2018-07-26 DIAGNOSIS — R918 Other nonspecific abnormal finding of lung field: Secondary | ICD-10-CM | POA: Diagnosis not present

## 2018-07-26 DIAGNOSIS — I4891 Unspecified atrial fibrillation: Secondary | ICD-10-CM | POA: Diagnosis not present

## 2018-07-26 DIAGNOSIS — S225XXA Flail chest, initial encounter for closed fracture: Secondary | ICD-10-CM | POA: Diagnosis not present

## 2018-07-26 DIAGNOSIS — R0689 Other abnormalities of breathing: Secondary | ICD-10-CM | POA: Diagnosis not present

## 2018-07-26 DIAGNOSIS — S32038A Other fracture of third lumbar vertebra, initial encounter for closed fracture: Secondary | ICD-10-CM | POA: Diagnosis not present

## 2018-07-26 DIAGNOSIS — G9340 Encephalopathy, unspecified: Secondary | ICD-10-CM | POA: Diagnosis not present

## 2018-07-26 DIAGNOSIS — N390 Urinary tract infection, site not specified: Secondary | ICD-10-CM | POA: Diagnosis not present

## 2018-07-26 DIAGNOSIS — Z0189 Encounter for other specified special examinations: Secondary | ICD-10-CM | POA: Diagnosis not present

## 2018-07-26 DIAGNOSIS — S32501D Unspecified fracture of right pubis, subsequent encounter for fracture with routine healing: Secondary | ICD-10-CM | POA: Diagnosis not present

## 2018-07-26 DIAGNOSIS — S36892A Contusion of other intra-abdominal organs, initial encounter: Secondary | ICD-10-CM | POA: Diagnosis not present

## 2018-07-26 DIAGNOSIS — R58 Hemorrhage, not elsewhere classified: Secondary | ICD-10-CM | POA: Diagnosis not present

## 2018-07-26 DIAGNOSIS — D72829 Elevated white blood cell count, unspecified: Secondary | ICD-10-CM | POA: Diagnosis not present

## 2018-07-26 DIAGNOSIS — S3282XA Multiple fractures of pelvis without disruption of pelvic ring, initial encounter for closed fracture: Secondary | ICD-10-CM | POA: Diagnosis not present

## 2018-07-26 DIAGNOSIS — R40243 Glasgow coma scale score 3-8, unspecified time: Secondary | ICD-10-CM | POA: Diagnosis not present

## 2018-07-26 DIAGNOSIS — S22038A Other fracture of third thoracic vertebra, initial encounter for closed fracture: Secondary | ICD-10-CM | POA: Diagnosis not present

## 2018-07-26 DIAGNOSIS — S32301A Unspecified fracture of right ilium, initial encounter for closed fracture: Secondary | ICD-10-CM | POA: Diagnosis not present

## 2018-07-26 DIAGNOSIS — S22080A Wedge compression fracture of T11-T12 vertebra, initial encounter for closed fracture: Secondary | ICD-10-CM | POA: Diagnosis not present

## 2018-07-26 DIAGNOSIS — E46 Unspecified protein-calorie malnutrition: Secondary | ICD-10-CM | POA: Diagnosis not present

## 2018-09-12 DEATH — deceased

## 2018-12-22 IMAGING — DX DG PORTABLE PELVIS
1 series · 1 of 1 positions shown · non-contrast
Comparison: 05/08/2017

CLINICAL DATA: Right hip replacement

EXAM:
PORTABLE PELVIS 1-2 VIEWS

[pelvis ap]
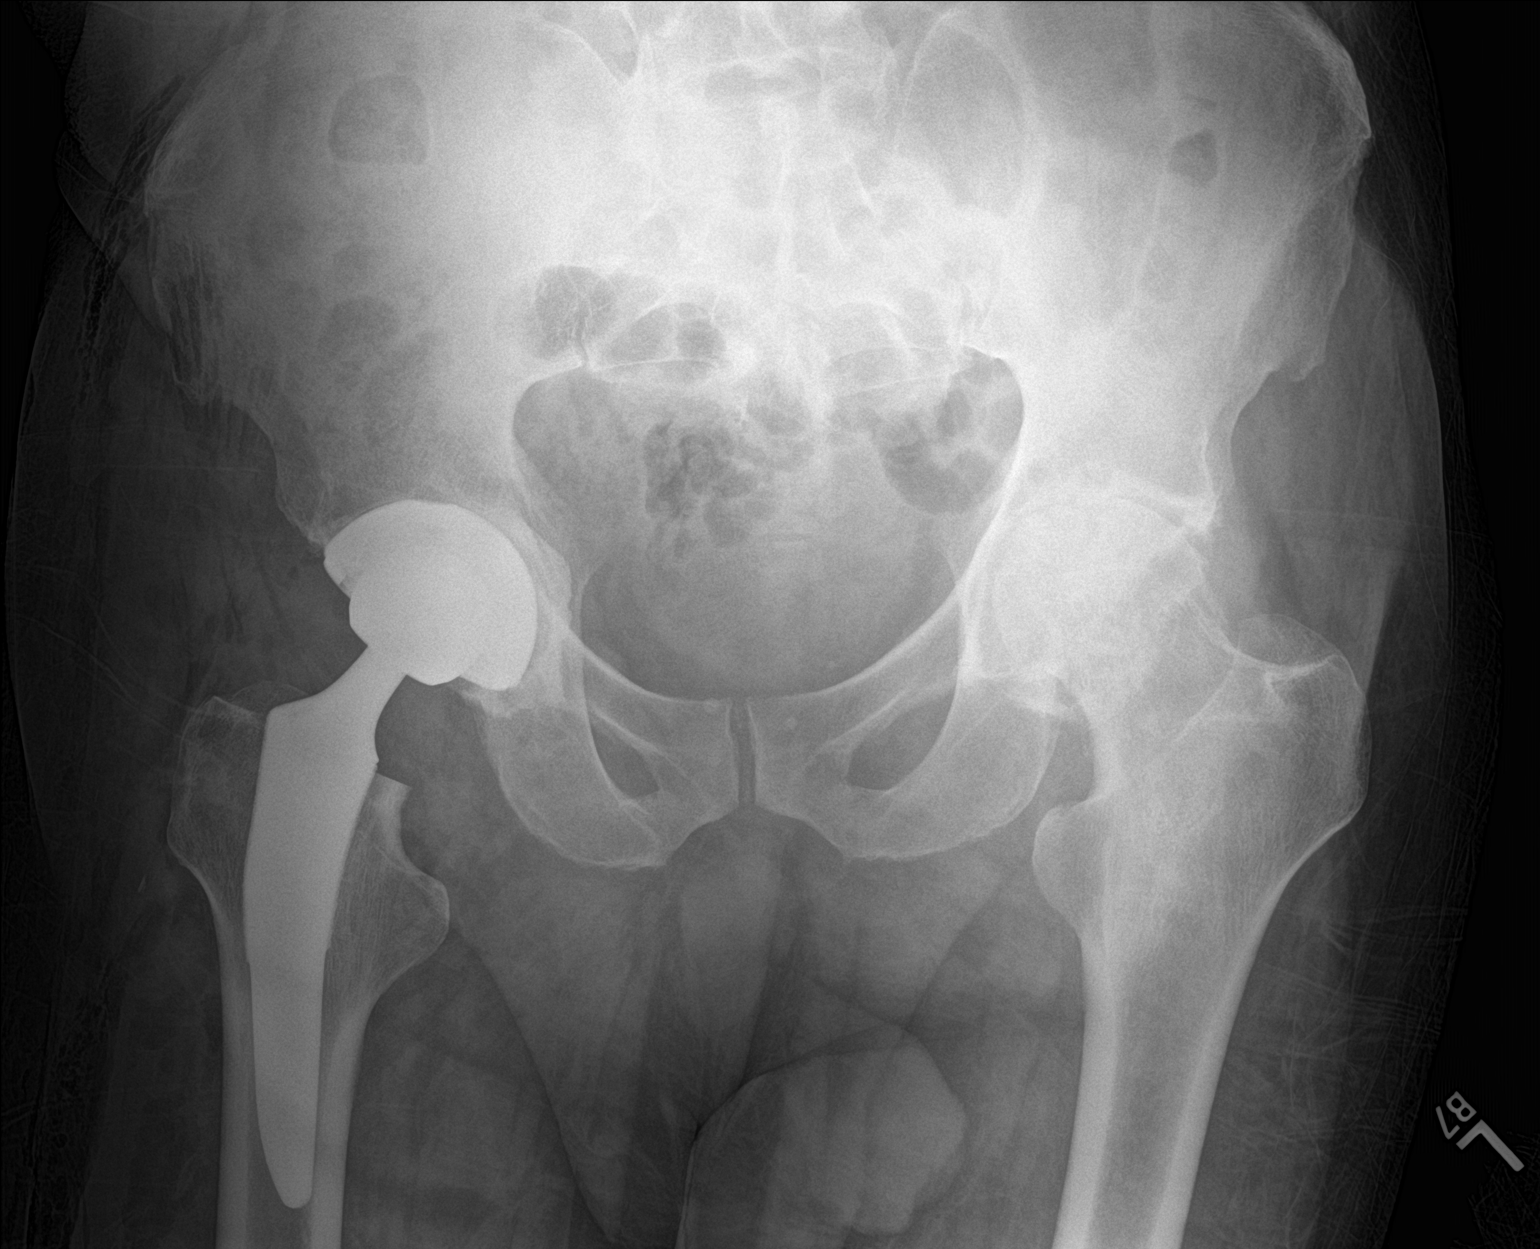

[1 of 1 positions shown; findings below may reference images not displayed]

FINDINGS: Right hip replacement satisfactory position alignment. No fracture
or complication

Advanced degenerative change left hip joint.
IMPRESSION: Satisfactory right hip replacement without acute abnormality.

## 2018-12-22 IMAGING — RF DG C-ARM 61-120 MIN
1 series · 1 of 1 positions shown · non-contrast
Comparison: None.

CLINICAL DATA: 85-year-old male post right hip replacement. Initial
encounter.

EXAM:
OPERATIVE right HIP (WITH PELVIS IF PERFORMED) single VIEW
TECHNIQUE: Fluoroscopic spot image(s) were submitted for interpretation
post-operatively.
Fluoroscopic time: 25 seconds.

[Series 1: run · 1 of 1 slices shown]
[im 1/1]
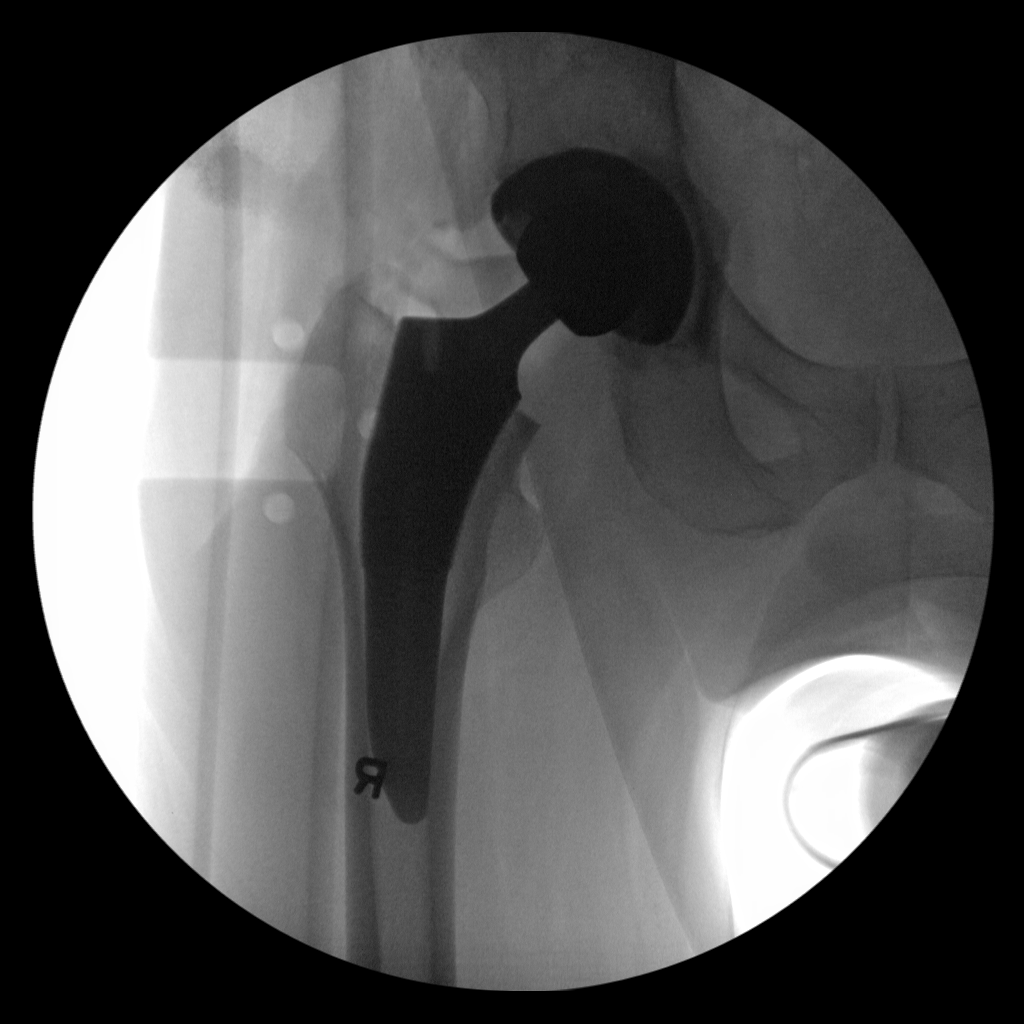

[1 of 1 positions shown; findings below may reference images not displayed]

FINDINGS: Post total right hip replacement which appears in satisfactory
position on frontal projection.
IMPRESSION: Post total right hip replacement.

## 2019-02-23 IMAGING — DX DG PORTABLE PELVIS
1 series · 1 of 1 positions shown · non-contrast
Comparison: Intraoperative films from earlier in the same day.

CLINICAL DATA: Status post left hip replacement

EXAM:
PORTABLE PELVIS 1-2 VIEWS

[pelvis ap]
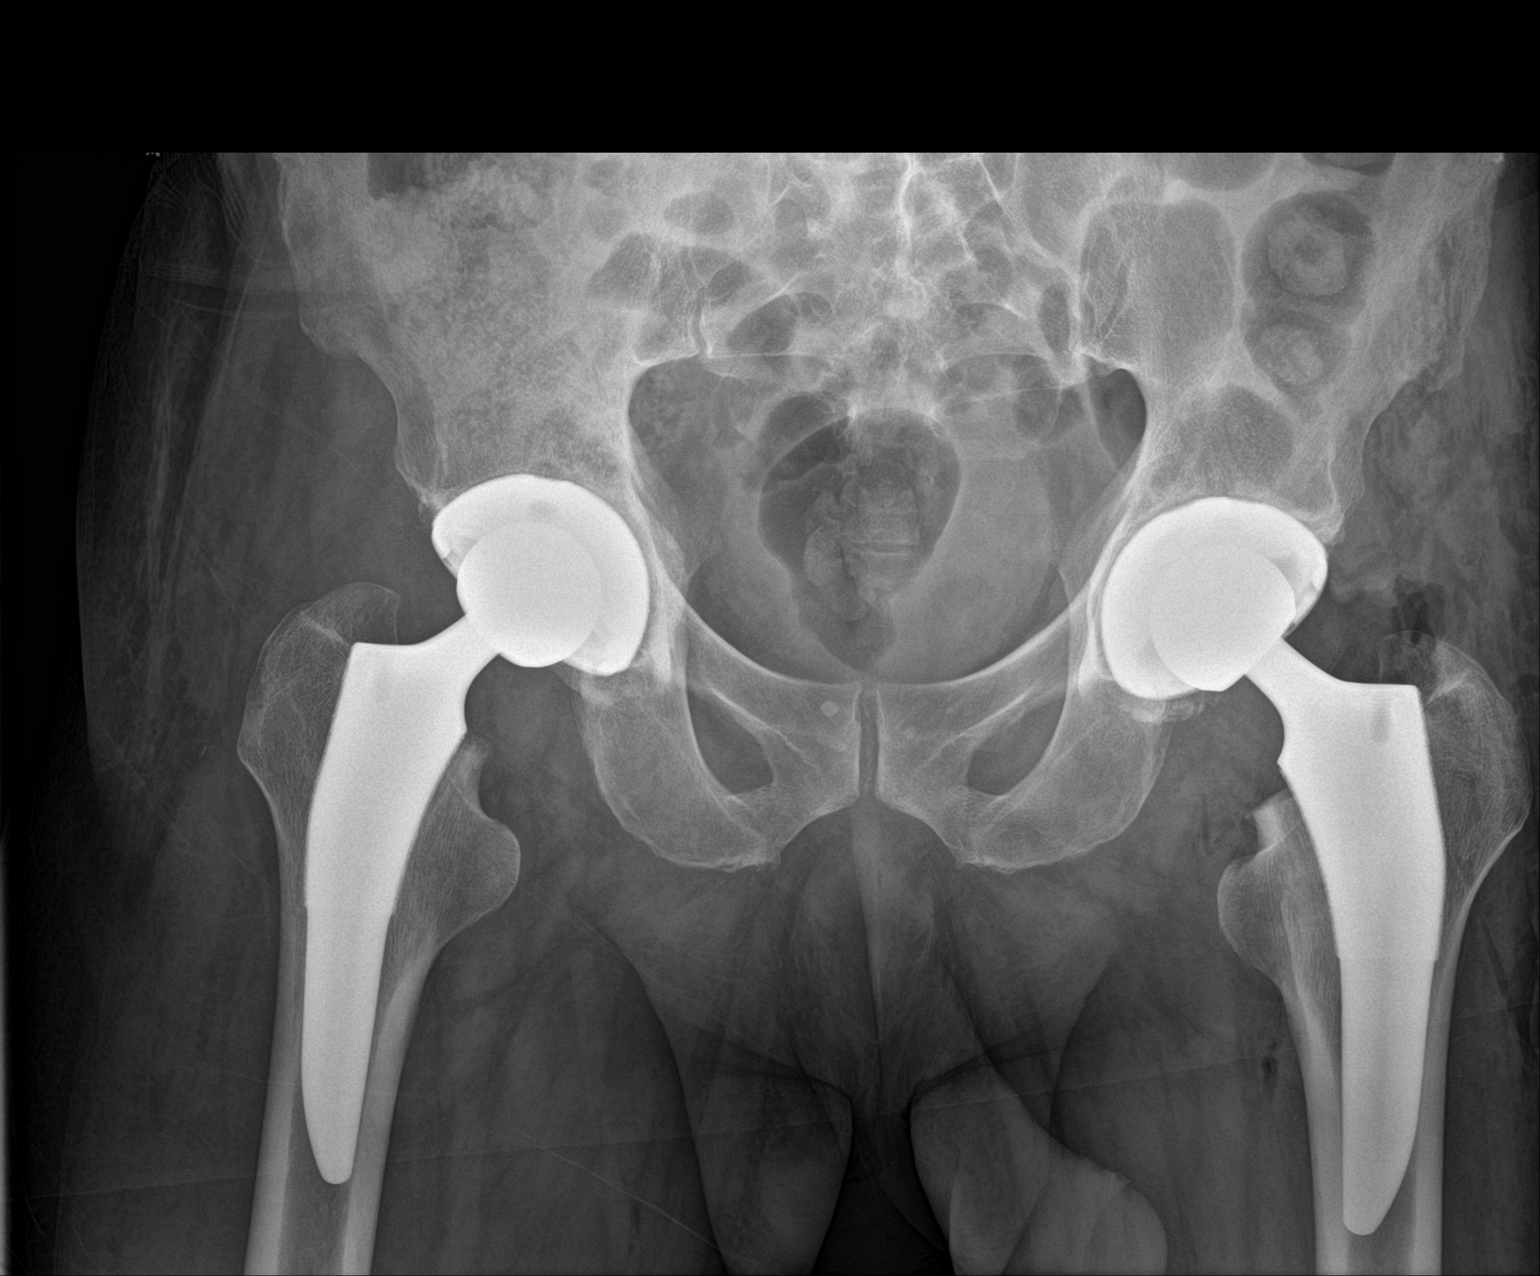

[1 of 1 positions shown; findings below may reference images not displayed]

FINDINGS: Bilateral hip replacements are now seen. No acute bony or soft
tissue abnormality is noted.
IMPRESSION: No acute abnormality noted.

## 2019-02-23 IMAGING — RF DG C-ARM 61-120 MIN
1 series · 2 of 2 positions shown · non-contrast
Comparison: None.

CLINICAL DATA: Intraoperative imaging for left hip replacement.

EXAM:
DG C-ARM 61-120 MIN; OPERATIVE LEFT HIP WITH PELVIS

[Series 1: run · 2 of 2 slices shown]
[im 1/2]
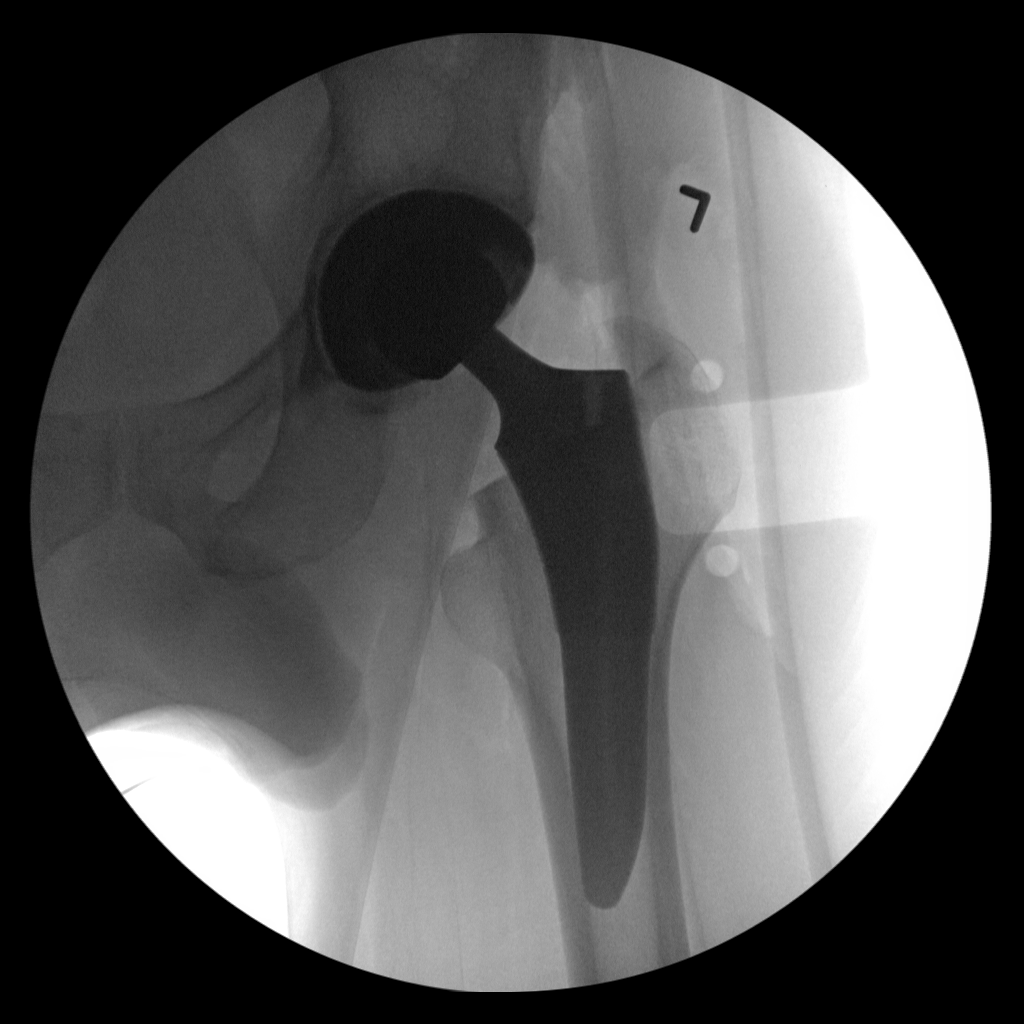
[im 2/2]
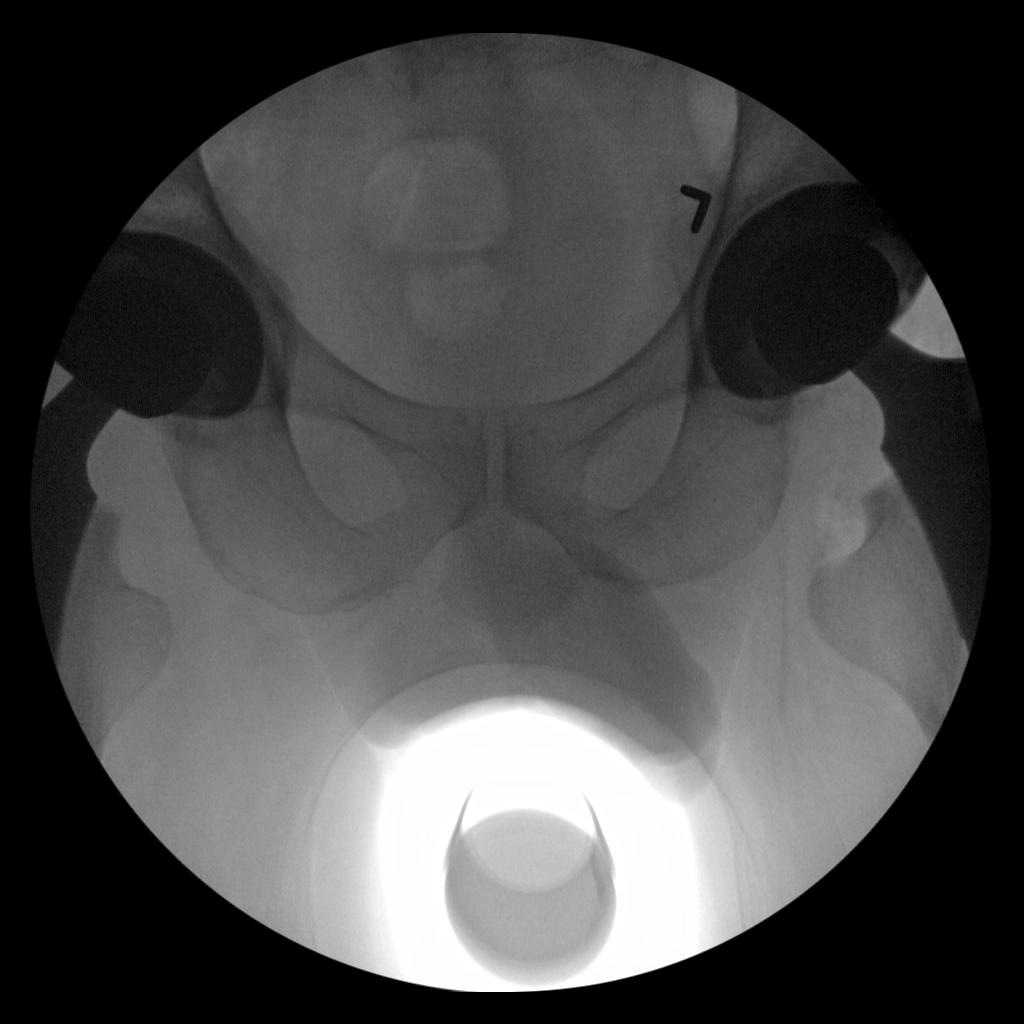

[2 of 2 positions shown; findings below may reference images not displayed]

FINDINGS: Single fluoroscopic view of the low pelvis and left hip are
provided. Images demonstrate a total hip arthroplasty in place. The
device is located. No fracture or other acute abnormality.
IMPRESSION: Intraoperative imaging for left hip replacement.
